# Patient Record
Sex: Female | Born: 1975 | Race: White | Hispanic: No | Marital: Married | Smoking: Never smoker
Health system: Southern US, Community
[De-identification: ages and names within clinical notes are randomized; demographics above are authoritative.]

## PROBLEM LIST (undated history)

## (undated) DIAGNOSIS — H9190 Unspecified hearing loss, unspecified ear: Secondary | ICD-10-CM

## (undated) DIAGNOSIS — L409 Psoriasis, unspecified: Secondary | ICD-10-CM

## (undated) DIAGNOSIS — J302 Other seasonal allergic rhinitis: Secondary | ICD-10-CM

## (undated) DIAGNOSIS — K219 Gastro-esophageal reflux disease without esophagitis: Secondary | ICD-10-CM

## (undated) DIAGNOSIS — R635 Abnormal weight gain: Secondary | ICD-10-CM

## (undated) DIAGNOSIS — T7840XA Allergy, unspecified, initial encounter: Secondary | ICD-10-CM

## (undated) HISTORY — DX: Gastro-esophageal reflux disease without esophagitis: K21.9

## (undated) HISTORY — DX: Allergy, unspecified, initial encounter: T78.40XA

## (undated) HISTORY — DX: Abnormal weight gain: R63.5

## (undated) HISTORY — PX: WISDOM TOOTH EXTRACTION: SHX21

## (undated) HISTORY — DX: Other seasonal allergic rhinitis: J30.2

## (undated) HISTORY — DX: Unspecified hearing loss, unspecified ear: H91.90

## (undated) HISTORY — DX: Psoriasis, unspecified: L40.9

---

## 1999-02-24 ENCOUNTER — Other Ambulatory Visit: Admission: RE | Admit: 1999-02-24 | Discharge: 1999-02-24 | Payer: Self-pay | Admitting: *Deleted

## 2000-03-05 ENCOUNTER — Other Ambulatory Visit: Admission: RE | Admit: 2000-03-05 | Discharge: 2000-03-05 | Payer: Self-pay | Admitting: *Deleted

## 2000-10-12 ENCOUNTER — Other Ambulatory Visit: Admission: RE | Admit: 2000-10-12 | Discharge: 2000-10-12 | Payer: Self-pay | Admitting: Obstetrics and Gynecology

## 2001-02-08 ENCOUNTER — Inpatient Hospital Stay (HOSPITAL_COMMUNITY): Admission: AD | Admit: 2001-02-08 | Discharge: 2001-02-08 | Payer: Self-pay | Admitting: Obstetrics and Gynecology

## 2001-05-14 ENCOUNTER — Inpatient Hospital Stay (HOSPITAL_COMMUNITY): Admission: AD | Admit: 2001-05-14 | Discharge: 2001-05-14 | Payer: Self-pay | Admitting: Obstetrics and Gynecology

## 2001-05-17 ENCOUNTER — Inpatient Hospital Stay (HOSPITAL_COMMUNITY): Admission: AD | Admit: 2001-05-17 | Discharge: 2001-05-19 | Payer: Self-pay | Admitting: Obstetrics and Gynecology

## 2001-05-22 ENCOUNTER — Encounter: Admission: RE | Admit: 2001-05-22 | Discharge: 2001-06-21 | Payer: Self-pay | Admitting: Obstetrics and Gynecology

## 2001-10-09 HISTORY — PX: CHOLECYSTECTOMY: SHX55

## 2002-01-28 ENCOUNTER — Other Ambulatory Visit: Admission: RE | Admit: 2002-01-28 | Discharge: 2002-01-28 | Payer: Self-pay | Admitting: Obstetrics and Gynecology

## 2002-07-18 ENCOUNTER — Emergency Department (HOSPITAL_COMMUNITY): Admission: EM | Admit: 2002-07-18 | Discharge: 2002-07-18 | Payer: Self-pay | Admitting: Emergency Medicine

## 2002-07-19 ENCOUNTER — Inpatient Hospital Stay (HOSPITAL_COMMUNITY): Admission: RE | Admit: 2002-07-19 | Discharge: 2002-07-21 | Payer: Self-pay | Admitting: Surgery

## 2002-07-19 ENCOUNTER — Encounter: Payer: Self-pay | Admitting: Surgery

## 2002-07-20 ENCOUNTER — Encounter: Payer: Self-pay | Admitting: Surgery

## 2002-07-21 ENCOUNTER — Encounter: Payer: Self-pay | Admitting: Surgery

## 2002-07-21 ENCOUNTER — Encounter (INDEPENDENT_AMBULATORY_CARE_PROVIDER_SITE_OTHER): Payer: Self-pay | Admitting: Specialist

## 2003-02-18 ENCOUNTER — Other Ambulatory Visit: Admission: RE | Admit: 2003-02-18 | Discharge: 2003-02-18 | Payer: Self-pay | Admitting: Obstetrics and Gynecology

## 2003-06-17 ENCOUNTER — Encounter: Payer: Self-pay | Admitting: Obstetrics and Gynecology

## 2003-06-17 ENCOUNTER — Inpatient Hospital Stay (HOSPITAL_COMMUNITY): Admission: AD | Admit: 2003-06-17 | Discharge: 2003-06-17 | Payer: Self-pay | Admitting: Obstetrics and Gynecology

## 2003-07-05 ENCOUNTER — Inpatient Hospital Stay (HOSPITAL_COMMUNITY): Admission: AD | Admit: 2003-07-05 | Discharge: 2003-07-05 | Payer: Self-pay | Admitting: Obstetrics and Gynecology

## 2003-09-17 ENCOUNTER — Inpatient Hospital Stay (HOSPITAL_COMMUNITY): Admission: AD | Admit: 2003-09-17 | Discharge: 2003-09-19 | Payer: Self-pay | Admitting: Obstetrics and Gynecology

## 2004-02-19 ENCOUNTER — Other Ambulatory Visit: Admission: RE | Admit: 2004-02-19 | Discharge: 2004-02-19 | Payer: Self-pay | Admitting: Obstetrics and Gynecology

## 2005-10-17 ENCOUNTER — Other Ambulatory Visit: Admission: RE | Admit: 2005-10-17 | Discharge: 2005-10-17 | Payer: Self-pay | Admitting: Obstetrics and Gynecology

## 2006-02-19 ENCOUNTER — Inpatient Hospital Stay (HOSPITAL_COMMUNITY): Admission: AD | Admit: 2006-02-19 | Discharge: 2006-02-19 | Payer: Self-pay | Admitting: Obstetrics and Gynecology

## 2006-05-19 ENCOUNTER — Inpatient Hospital Stay (HOSPITAL_COMMUNITY): Admission: AD | Admit: 2006-05-19 | Discharge: 2006-05-21 | Payer: Self-pay | Admitting: Obstetrics and Gynecology

## 2009-08-23 LAB — CONVERTED CEMR LAB: Pap Smear: NORMAL

## 2010-07-26 ENCOUNTER — Ambulatory Visit: Payer: Self-pay | Admitting: Internal Medicine

## 2010-07-29 ENCOUNTER — Ambulatory Visit: Payer: Self-pay | Admitting: Internal Medicine

## 2010-08-01 LAB — CONVERTED CEMR LAB
BUN: 16 mg/dL (ref 6–23)
Basophils Absolute: 0 10*3/uL (ref 0.0–0.1)
Basophils Relative: 0.6 % (ref 0.0–3.0)
CO2: 27 meq/L (ref 19–32)
Calcium: 9.2 mg/dL (ref 8.4–10.5)
Chloride: 105 meq/L (ref 96–112)
Cholesterol: 142 mg/dL (ref 0–200)
Creatinine, Ser: 0.7 mg/dL (ref 0.4–1.2)
Eosinophils Absolute: 0.5 10*3/uL (ref 0.0–0.7)
Eosinophils Relative: 8 % — ABNORMAL HIGH (ref 0.0–5.0)
GFR calc non Af Amer: 104.87 mL/min (ref >60)
Glucose, Bld: 88 mg/dL (ref 70–99)
HCT: 37.5 % (ref 36.0–46.0)
HDL: 38.6 mg/dL — ABNORMAL LOW (ref >39.00)
Hemoglobin: 13 g/dL (ref 12.0–15.0)
LDL Cholesterol: 93 mg/dL (ref 0–99)
Lymphocytes Relative: 28 % (ref 12.0–46.0)
Lymphs Abs: 1.7 10*3/uL (ref 0.7–4.0)
MCHC: 34.8 g/dL (ref 30.0–36.0)
MCV: 88.6 fL (ref 78.0–100.0)
Monocytes Absolute: 0.6 10*3/uL (ref 0.1–1.0)
Monocytes Relative: 9.1 % (ref 3.0–12.0)
Neutro Abs: 3.4 10*3/uL (ref 1.4–7.7)
Neutrophils Relative %: 54.3 % (ref 43.0–77.0)
Platelets: 222 10*3/uL (ref 150.0–400.0)
Potassium: 4 meq/L (ref 3.5–5.1)
RBC: 4.23 M/uL (ref 3.87–5.11)
RDW: 13.1 % (ref 11.5–14.6)
Sodium: 140 meq/L (ref 135–145)
TSH: 0.81 microintl units/mL (ref 0.35–5.50)
Total CHOL/HDL Ratio: 4
Triglycerides: 53 mg/dL (ref 0.0–149.0)
VLDL: 10.6 mg/dL (ref 0.0–40.0)
WBC: 6.2 10*3/uL (ref 4.5–10.5)

## 2010-11-08 NOTE — Assessment & Plan Note (Signed)
Summary: NEW P T TO ESTABH/DLO   Vital Signs:  Patient profile:   35 year old female Height:      66.5 inches (168.91 cm) Weight:      151.50 pounds (68.86 kg) BMI:     24.17 Temp:     98.2 degrees F (36.78 degrees C) oral Pulse rate:   78 / minute Pulse rhythm:   regular BP sitting:   116 / 70  (left arm) Cuff size:   regular  Vitals Entered By: Selena Batten Dance CMA Duncan Dull) (July 26, 2010 10:26 AM) CC: New patient to establish care   History of Present Illness: CC: new to establish  here to establish.    o/w healthy.  some dry skin here and there, h/o psoriasis, better after pregnancy.  Well woman exams at [facility].  Nl paps, breast exams.   flu shot - would like today. tetanus - unsure when last one was.  Tdap today. never had cholesterol checked  -  Date:  08/23/2009    PAP normal  Current Medications (verified): 1)  None  Allergies (verified): No Known Drug Allergies  Past History:  Past Medical History: seasonal allergies controlled OTC  Past Surgical History: wisdome teeth extracted cholecystectomy 2003  Family History: F: HTN, HLD M: BRCA at 35yo s/p lumpectomy MGF: prostate CA MGM: throat CA PGF: CAD/MI 50s  No other CA, CVA, DM  Social History: No smoking, no EtOH, no rec drugs Occupation: stay at home mom Lives with husband, 3 children (2002, 2004, 2007), 1 dog  Review of Systems  The patient denies anorexia, fever, weight loss, weight gain, vision loss, decreased hearing, hoarseness, chest pain, syncope, dyspnea on exertion, peripheral edema, prolonged cough, headaches, hemoptysis, abdominal pain, melena, hematochezia, severe indigestion/heartburn, hematuria, incontinence, muscle weakness, suspicious skin lesions, transient blindness, difficulty walking, depression, and breast masses.         no n/v/d/c.  Physical Exam  General:  Well-developed,well-nourished,in no acute distress; alert,appropriate and cooperative throughout  examination Head:  Normocephalic and atraumatic without obvious abnormalities. No apparent alopecia or balding. Eyes:  No corneal or conjunctival inflammation noted. EOMI. Perrla.  Ears:  External ear exam shows no significant lesions or deformities.  Otoscopic examination reveals clear canals, tympanic membranes are intact bilaterally without bulging, retraction, inflammation or discharge. Hearing is grossly normal bilaterally. Nose:  External nasal examination shows no deformity or inflammation. Nasal mucosa are pink and moist without lesions or exudates. Mouth:  Oral mucosa and oropharynx without lesions or exudates.  Teeth in good repair. Neck:  No deformities, masses, or tenderness noted. Lungs:  Normal respiratory effort, chest expands symmetrically. Lungs are clear to auscultation, no crackles or wheezes. Heart:  Normal rate and regular rhythm. S1 and S2 normal without gallop, murmur, click, rub or other extra sounds. Abdomen:  Bowel sounds positive,abdomen soft and non-tender without masses, organomegaly or hernias noted. Msk:  No deformity or scoliosis noted of thoracic or lumbar spine.   Pulses:  2+ rad pulses Extremities:  no edema Neurologic:  CN grossly intact, station and gait intact Skin:  Intact without suspicious lesions or rashes Psych:  full affect, pleasant   Impression & Recommendations:  Problem # 1:  HEALTH MAINTENANCE EXAM (ICD-V70.0) flu shot and tdap today.  blood work when returns fasting this week or next week for insurance purposes.  well woman at [facility].  Other Orders: Flu Vaccine 65yrs + (16109) Admin 1st Vaccine (60454) Tdap => 49yrs IM (09811) Admin of Any Addtl Vaccine (  16109)  Patient Instructions: 1)  Return fasting or blood work [FLP, BMP, TSH, CBC V70.0, ] 2)  Tetanus (Tdap) and flu shot today. 3)  Pleasure to meet you today!  Call clinic with questions.   Orders Added: 1)  New Patient 18-39 years [99385] 2)  Flu Vaccine 18yrs + [90658] 3)   Admin 1st Vaccine [90471] 4)  Tdap => 34yrs IM [90715] 5)  Admin of Any Addtl Vaccine [90472]   Immunizations Administered:  Influenza Vaccine # 1:    Vaccine Type: Fluvax 3+    Site: right deltoid    Mfr: GlaxoSmithKline    Dose: 0.5 ml    Route: IM    Given by: Selena Batten Dance CMA (AAMA)    Exp. Date: 04/08/2011    Lot #: [id number]    VIS given: 05/03/10 version given July 26, 2010.  Tetanus Vaccine:    Vaccine Type: Tdap    Site: left deltoid    Mfr: GlaxoSmithKline    Dose: 0.5 ml    Route: IM    Given by: Selena Batten Dance CMA (AAMA)    Exp. Date: 07/28/2012    Lot #: [id number]    VIS given: 08/26/08 version given July 26, 2010.  Flu Vaccine Consent Questions:    Do you have a history of severe allergic reactions to this vaccine? no    Any prior history of allergic reactions to egg and/or gelatin? no    Do you have a sensitivity to the preservative Thimersol? no    Do you have a past history of Guillan-Barre Syndrome? no    Do you currently have an acute febrile illness? no    Have you ever had a severe reaction to latex? no    Vaccine information given and explained to patient? yes    Are you currently pregnant? no   Immunizations Administered:  Influenza Vaccine # 1:    Vaccine Type: Fluvax 3+    Site: right deltoid    Mfr: GlaxoSmithKline    Dose: 0.5 ml    Route: IM    Given by: Janee Morn CMA (AAMA)    Exp. Date: 04/08/2011    Lot #: [id number]    VIS given: 05/03/10 version given July 26, 2010.  Tetanus Vaccine:    Vaccine Type: Tdap    Site: left deltoid    Mfr: GlaxoSmithKline    Dose: 0.5 ml    Route: IM    Given by: Selena Batten Dance CMA (AAMA)    Exp. Date: 07/28/2012    Lot #: [id number]    VIS given: 08/26/08 version given July 26, 2010.  Prior Medications: None Current Allergies (reviewed today): No known allergies    Prevention & Chronic Care Immunizations   Influenza vaccine: Fluvax 3+  (07/26/2010)    Tetanus booster: 07/26/2010:  Tdap   Tetanus booster due: 07/26/2020    Pneumococcal vaccine: Not documented  Other Screening   Pap smear: normal  (08/23/2009)   Smoking status: Not documented

## 2011-02-24 NOTE — Consult Note (Signed)
NAME:  Linda Kemp, Linda Kemp NO.:  [id number]   MEDICAL RECORD NO.:  [id number]                   PATIENT TYPE:  INP   LOCATION:  0455                                 FACILITY:  [facility]   PHYSICIAN:  Iva Boop, M.D. [facility]           DATE OF BIRTH:  1975/12/03   DATE OF CONSULTATION:  07/19/2002  DATE OF DISCHARGE:                                   CONSULTATION   REQUESTING PHYSICIAN:  Currie Paris, M.D.   PRIMARY CARE PHYSICIAN:  Marne A. Tower, M.D.   CHIEF COMPLAINT/REASON FOR CONSULTATION:  Elevated liver chemistries,  jaundice, cholelithiasis.   HISTORY OF PRESENT ILLNESS:  This is a pleasant 35 year old white female  that had been doing reasonably well until the past week or so when she has  had intermittent worsening epigastric and chest pain radiating to the back.  This is postprandial, and has been accompanied by nausea and vomiting.  She  saw Dr. Milinda Antis.  An ultrasound was performed at [facility], and according to Dr.  Jamey Ripa there were gallstones, and she had a dilated common bile duct as it  was communicated to me.  She was to have a laparoscopic cholecystectomy, but  her bilirubin was found to be greater than 3, and her AST and ALT were  elevated as well.  He was concerned about possible choledocholithiasis.  She  is admitted to the hospital.  She is comfortable at this time.   REVIEW OF SYMPTOMS:  A comprehensive review of systems is negative for  fever, respiratory complaints, and all other systems are negative except for  mild pruritus at this time.   HOME MEDICATIONS:  1. Birth control pills.  2. Vicodin.  3. Multivitamin.   ALLERGIES:  No known drug allergies.  No contrast or iodine allergy.   PAST MEDICAL HISTORY:  1. Psoriasis.  2. Childbirth.   FAMILY HISTORY:  Her mother has had a cholecystectomy.  Father has had  gastroesophageal reflux disease.  No colon cancer.   SOCIAL HISTORY:  She has been a Runner, broadcasting/film/video.  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  No tobacco or alcohol.  She is here with her husband.   PHYSICAL EXAMINATION:  GENERAL:  The patient is in no acute distress at this  time.  VITAL SIGNS:  Blood pressure is 100/68, weight 152 pounds, pulse 65,  respirations 20, temperature 97.6.  HEENT:  Eyes with slight icterus.  Mouth:  Palate slightly icteric.  SKIN:  Looks normal, but perhaps slightly icteric.  LUNGS:  Clear.  NECK:  Supple, no thyromegaly or mass.  HEART:  S1 and S2, no murmurs, rubs, or gallops.  ABDOMEN:  Soft, nontender, bowel sounds are present.  There is no  organomegaly or mass.  EXTREMITIES:  No cyanosis, clubbing, or edema.  LYMPH NODES:  No neck or supraclavicular nodes, no inguinal nodes.  MENTAL STATUS:  She is alert and oriented x3.   LABORATORY DATA:  Labs from 07/18/02, showed hemoglobin 17, hematocrit 49%,  white count 9000.  BMET looked normal.  hCG negative.  Her glucose was 121.  Urine had a large amount of bile.  Today's labs:  Lipase 18, amylase 42,  these are normal.  Hepatic functions show alkaline phosphatase 177, total  bilirubin 3.6, direct 1.9, indirect 1.7, SGOT 91, SGPT 214, total protein  7.3, albumin 3.7.   ASSESSMENT:  Cholelithiasis with mild jaundice and dilated common bile duct  on ultrasound per verbal information.  Even without dilation, the elevated  bilirubin is indicative of increased risk of common duct stones.   PLAN:  Endoscopic retrograde cholangiopancreatography to evaluate the bile  duct for possible common duct stones and removal if necessary.  I fully  explained the risks, benefits, indications regarding endoscopic retrograde  cholangiopancreatography, biliary sphincterotomy, and stone extraction.  Her  husband was present for that conversation.  She understands these and agrees  to proceed.   I appreciate the opportunity to care for this patient.                                                Iva Boop, M.D.  [facility]    CEG/MEDQ  D:  07/19/2002  T:  07/19/2002  Job:  [id number]   cc:   Currie Paris, M.D.  Fax: [phone]   Marne A. Milinda Antis, M.D. [facility]

## 2011-02-24 NOTE — Op Note (Signed)
   NAME:  Linda Kemp, Linda Kemp                        ACCOUNT NO.:  [id number]   MEDICAL RECORD NO.:  [id number]                   PATIENT TYPE:  INP   LOCATION:  0455                                 FACILITY:  [facility]   PHYSICIAN:  Lina Sar, MD [facility]                 DATE OF BIRTH:  [birth year]   DATE OF PROCEDURE:  07/21/2002  DATE OF DISCHARGE:                                 OPERATIVE REPORT   PROCEDURE:  ERCP.   INDICATIONS FOR PROCEDURE:  This 35 year old black female presented with  choledocholithiasis and cholecystitis.  She underwent laparoscopic  cholecystectomy yesterday with findings of retained common bile duct stone  in the distal common bile duct.  She had an ERCP prior to the  cholecystectomy with sphincterotomy and sweep of the common bile duct with  removal of small stone.  She is now undergoing repeat ERCP because of  retained stone in the distal common bile duct.   ENDOSCOPE:  Olympus single channel side viewing duodenoscope.   SEDATION:  Versed 10 mg, IV Demerol 90 mg IV.   FINDINGS:  Olympus single channel side viewing duodenoscope  was passed  blindly through the esophagus into the stomach, through the pylorus, into  the duodenum.  Minor papilla was located and each papilla was distal to that  and it showed signs of recent sphincterotomy.  It was cannulated without  difficulty with replacement of a guidewire into the common bile duct.  Cholangiogram showed normal size common bile duct of about 5 mm in diameter  with retained distal common bile duct stone at the location which was  identical to the one on intraoperative cholangiogram.  An 8.5 mm balloon  swept the common bile duct under fluoroscopic guidance with recovery of 5 mm  multifaceted stone.  Two more sweeps of the common bile duct did not yield  any additional stones or gravel.  An occlusion cholangiogram was obtained  with showing abnormal common bile duct.  Cystic duct remnant was seen.   IMPRESSION:   Choledocholithiasis with retained distal common bile duct  stones, status post removal via previous sphincterotomy.   PLAN:  Observation this morning.  The patient may advance diet.  She may be  able to be discharged later today and follow up with Dr. Jamey Ripa for  postoperative check up.                                                Lina Sar, MD [facility]    DB/MEDQ  D:  07/21/2002  T:  07/21/2002  Job:  [id number]   cc:   Currie Paris, M.D.  Fax: [phone]

## 2011-02-24 NOTE — Discharge Summary (Signed)
[facility]  Patient:    Linda Kemp, Linda Kemp                     MRN: [id number] Adm. Date:  [id number] Disc. Date: 05/19/01 Attending:  Leonard Schwartz Dictator:   Philipp Deputy, C.N.M.                           Discharge Summary  DATE OF BIRTH:                [birth year].  ADMITTING DIAGNOSES:          1. Intrauterine pregnancy at 41-3/7 weeks.                               2. Postdates elective induction.  DISCHARGE DIAGNOSES:          1. Intrauterine pregnancy at term.                               2. Operative vaginal birth with vacuum                                  extraction.  PROCEDURES:                   1. Operative vaginal birth.                               2. Repair of second-degree laceration.  HOSPITAL COURSE:              Linda Kemp is a 35 year old primigravida who presented for induction of labor secondary to postdates on May 17, 2001. Pregnancy had been remarkable for (1) irregular cycles, (2) has foster child with HIV, (3) father of the baby is adopted, (4) Rh negative, (5) history of psoriasis, and (6) positive group B strep. Upon admission, the patient was 3 to 4 cm and agreed to artificial rupture of membranes as method of labor induction. She progressed steadily in labor throughout the day needing Pitocin for augmentation at approximately 5 cm. When she got to complete dilatation, she required a vacuum extraction secondary to nonreassuring tracing. Vacuum delivery was performed by Dr. Stefano Gaul for a viable female infant, Fleet Contras, weight was 8 pounds 12 ounces with Apgars of 9 and 9. The patient was breast-feeding. Hemoglobin was 12.3 on day #1 postpartum. By day #2 postpartum the patient was doing well and was deemed to have received the full benefit of her hospital stay. She desired Micronor for contraception. She was discharged home.  DISCHARGE INSTRUCTIONS:       Instructions are per [facility] handout.  DISCHARGE MEDICATIONS:        1. Motrin 600 mg p.o. q.6h. p.r.n. pain.                               2. Prenatal vitamin one p.o. q.d.                               3. Micronor one p.o. q.d. to start two Sundays  after delivery.  DISCHARGE FOLLOWUP:           Followup will occur at six weeks postpartum at [facility]. DD:  05/19/01 TD:  05/19/01 Job: [id number]

## 2011-02-24 NOTE — Op Note (Signed)
[facility]  Patient:    Linda Kemp, Linda Kemp                     MRN: [id number] Proc. Date: 05/17/01 Adm. Date:  [id number] Attending:  Leonard Schwartz                           Operative Report  PREOPERATIVE DIAGNOSES:       1. A 41-1/[redacted] week gestation.                               2. Nonreassuring fetal heart rate tracing.  POSTOPERATIVE DIAGNOSES:      1. A 41-1/[redacted] week gestation.                               2. Nonreassuring fetal heart rate tracing.                               3. Second degree midline laceration.  PROCEDURE:                    Vacuum extraction vaginal delivery with repair                               of second degree midline laceration.  OBSTETRICIAN:                 Janine Limbo, M.D.  FIRST ASSISTANT:              Wynelle Bourgeois, CNM  ANESTHETIC:                   Epidural.  DISPOSITION:                  The patient is a 35 year old female, gravida 1, para 0.  She presents at 41-1/[redacted] weeks gestation.  She was admitted for induction on May 17, 2001.  She was started on Pitocin as well as having her membranes ruptured.  She dilated her cervix to 10 cm and began pushing.  With pushing, she began having variable decelerations.  She was able to bring the babys head to a +3 station, but then was not making progress beyond that point.  Her pelvis was noted to be adequate.  The estimated fetal weight was approximately 7.5 to 8 pounds.  We discussed our options for management. These options included observation only, continued pushing, operative vaginal delivery, and cesarean section.  The risks and benefits of each of those options were reviewed.  The patient elected to proceed with operative vaginal delivery.  We discussed the merits of forceps vaginal delivery versus vacuum extraction of vaginal delivery.  The patient and her husband elected to proceed with vacuum extraction vaginal delivery.  The specific  risks associated with vacuum extraction were outlined including a risk of caput formation, hematoma formation, the rare risk of intracranial bleeding, and the risks that the vacuum extraction would be unsuccessful and that we would still need to proceed with cesarean delivery.  After carefully considering all those options, the patient was ready to proceed with vacuum extraction.  FINDINGS:  The weight of the infant is currently not known.  A female infant was  delivered named Fleet Contras.  The Apgars were 9 at one minute and 9 at five minutes.  There was a three-vessel umbilical cord present.  The placenta appeared normal.  There was a second degree midline laceration present.  There were no upper vaginal or cervical lacerations present.  PROCEDURE:  The patient was placed in a lithotomy position.  The perineum and vagina were prepped with multiple layers of Betadine and then sterilely draped.  The bladder had previously been drained of urine.  The fetal head was at a +3 station.  The cervix was completely dilated and 100% effaced.  The infant was in an occiput anterior presentation.  The KIWI vacuum extractor was applied and the patient was allowed to push.  We were able to deliver the fetal head without difficulty.  The mouth and nose were suctioned.  The remained of the infant was then delivered.  The cord was clamped and cut and the infant was allowed to remain on the mothers abdomen for bonding.  Routine cord blood studies were obtained.  The placenta was removed.  The second degree midline laceration was closed first by putting reinforcing sutures in the capsule of the sphincter ani.  A 2-0 Vicryl was used for this.  We then used 3-0 Vicryl in a running fashion to close the second degree midline laceration with the standard closure.  The patient tolerated her procedure well.  She was returned to the supine position.  The estimated blood loss for the procedure was 300 cc.  The infant,  again, was allowed to remain in the room with the mother and father.  The patient was returned to the supine position. DD:  05/17/01 TD:  05/18/01 Job: [id number]

## 2011-02-24 NOTE — Op Note (Signed)
NAME:  Linda Kemp, Linda Kemp                        ACCOUNT NO.:  [id number]   MEDICAL RECORD NO.:  [id number]                   PATIENT TYPE:  INP   LOCATION:  0455                                 FACILITY[facility]  [facility]   PHYSICIAN:  Currie Paris, M.D.           DATE OF BIRTH:  Jul 15, 1976   DATE OF PROCEDURE:  07/20/2002  DATE OF DISCHARGE:                                 OPERATIVE REPORT   PREOPERATIVE DIAGNOSIS:  Chronic calculus cholecystitis.   POSTOPERATIVE DIAGNOSES:  1. Chronic calculus cholecystitis.  2. Choledocholithiasis.   OPERATION:  Laparoscopic cholecystectomy with operative cholangiogram.   SURGEON:  Currie Paris, M.D.   ASSISTANT:  Adolph Pollack, M.D.   ANESTHESIA:  General endotracheal.   CLINICAL HISTORY:  This patient is a 35 year old with recent episode of  biliary colic type symptoms and also some radiating through to her back plus  jaundice.  She was admitted yesterday and underwent ERCP with removal of  stones from her common duct.  She was scheduled electively today for  cholecystectomy.   DESCRIPTION OF PROCEDURE:  The patient was seen in the holding area and had  no further questions.  She was taken to the operating room and after  satisfactory general endotracheal anesthesia had been obtained, the abdomen  was prepped and draped.  Marcaine 0.25% plain was used for each incision,  and the umbilical incision was made first.  The fascia was opened and the  peritoneal cavity entered under direct vision.  A pursestring was placed and  the Hasson introduced and the abdomen insufflated to 15.   The abdomen basically appeared normal.  The patient was placed in reverse  Trendelenburg, tilted to the left.  A 10 mm trocar was placed in the  epigastrium and two 5's laterally.  The gallbladder was retracted over the  liver, and traction on the infundibulum and the area of the peritoneum over  the cystic duct was opened.  This tissue was fairly  thickened with some  chronic inflammatory changes, and I gently got around what I thought was the  cystic duct and could trace it down towards the common duct, and I could see  what appeared to be a dilated common duct.  I also found the anterior cystic  artery and got around that and clipped it and divided it so that I would  have a little bit more mobility here and opened up a fairly big window  behind the cystic duct.   The cystic duct was clipped once at its junction with the gallbladder and  opened.  I then introduced a Cook catheter, and operative cholangiography  was done.  I could see the cystic duct filling, common duct filling into the  duodenum, and into the hepatic radicles.  However, there did appear to be a  filling defect consistent with an oblong-looking stone.  I decided not to do  any further manipulation, although the cystic  duct was quite thickened such  that I could not get a clip all the way across it.  Most of this was  thickened in the wall, and the lumen was fairly small.  In addition, I was  afraid to manipulate the distal duct too much with the recent  sphincterotomy.  Therefore, we elected not to do any common duct  manipulation today.   The cystic duct was divided and then grasped and two Endoloops placed on it  using 0 chromic.  These tied down easily, and then a clip was placed over  distal to those two.   Retraction, I identified what I thought initially was the posterior artery  which was clipped and divided and I was coming up, we found another branch  which actually was the posterior artery, and several clips were placed on  that.  The gallbladder was removed from below to above and, just prior to  disconnecting, we irrigated and made sure everything appeared to be dry.  The gallbladder was disconnected, placed in a bag, and brought out the  umbilical port.  That port was occluded for a few moments while we irrigated  and made a final check for  hemostasis and again, everything appeared to be  dry.  The lateral ports were removed, and there was no bleeding.  The umbilical  port was removed and the pursestring tied down to close this using the  camera under direct vision to make sure we did not catch any loops of bowel  in that closure.  The abdomen was deflated through the epigastric port.  The  skin was closed with 4-0 Monocryl subcuticular plus Steri-Strips.  The  patient tolerated the procedure well.  There were no operative  complications, and all counts were correct.                                               Currie Paris, M.D.    CJS/MEDQ  D:  07/20/2002  T:  07/20/2002  Job:  [id number]   cc:   Marne A. Milinda Antis, M.D. [facility]   Iva Boop, M.D. [facility]  [address] [place], [place] [address]  Fax: [phone]
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
## 2011-02-24 NOTE — Discharge Summary (Signed)
   Linda Kemp, Linda Kemp                        ACCOUNT NO.:  [id number]   MEDICAL RECORD NO.:  [id number]                   PATIENT TYPE:  INP   LOCATION:  0455                                 FACILITY:  [facility]   PHYSICIAN:  Currie Paris, M.D.           DATE OF BIRTH:  Aug 06, 1976   DATE OF ADMISSION:  07/19/2002  DATE OF DISCHARGE:  07/21/2002                                 DISCHARGE SUMMARY   FINAL DIAGNOSES:  1. Chronic calculus cholecystitis.  2. Choledocholithiasis.   CLINICAL HISTORY:  The patient is a 35 year old woman who presented with  abdominal pain, nausea, jaundice, and cholelithiasis.  Details are noted in  the admission note.  Clinically, she was thought to have chronic  cholecystitis with a common duct stone.  The patient was admitted on October  11 and ERCP accomplished that day with a single 7 mm stone removed from the  common duct.  She felt better the next day and was taken to the operating  room where laparoscopic cholecystectomy was done.  Operative cholangiogram,  unfortunately, showed a residual common duct stone so the patient was kept  an extra day and on October 13 underwent another ERCP with another stone  retrieved and clearance of the common duct.  She tolerated that nicely and  was able to be discharged later that day.   The patient was sent home on Vicodin for pain, regular diet as tolerated,  showers as needed, and to follow up in our office in approximately two  weeks.   Pathology report confirmed chronic cholecystitis and cholelithiasis.   LABORATORY STUDIES:  Initial bilirubin 3.6 and on October 13 just prior to  her last ERCP was down to 2.1.  She had mild elevations in AST, ALT, and ALP  as well.                                               Currie Paris, M.D.    CJS/MEDQ  D:  08/07/2002  T:  08/07/2002  Job:  [id number]   cc:   Marne A. Milinda Antis, M.D. [facility]   Iva Boop, M.D. [facility]  [address] [place], [place] [address]  Fax: 1

## 2011-02-24 NOTE — H&P (Signed)
NAME:  Linda Kemp, HUSH NO.:  [id number]   MEDICAL RECORD NO.:  [id number]          PATIENT TYPE:  MAT   LOCATION:  MATC                          [facility]ILITY:  [facility]   PHYSICIAN:  Osborn Coho, M.D.   DATE OF BIRTH:  May 22, 1976   DATE OF ADMISSION:  05/19/2006  DATE OF DISCHARGE:                                HISTORY & PHYSICAL   Linda Kemp is a 35 year old gravida 3, para 2-0-0-2, at 40-4/7 weeks,  who presented with irregular contractions since yesterday.  Cervix was 3-4  cm in the office then.  Her pregnancy has been remarkable for:   1. Rh negative.  2. Positive group B strep.  3. Plans vasectomy.   PRENATAL LABORATORY DATA:  Blood type is O negative, Rh antibody negative.  VDRL nonreactive.  Rubella titer positive.  Hepatitis B surface antigen  negative.  Cystic fibrosis testing was negative.  Pap was normal in January.  GC and chlamydia cultures were declined.  Group B strep culture was positive  at 36 weeks.  Hemoglobin upon entering the practice was 12.6.  It was 11.9  at 26 weeks.  Glucola was normal.  EDC of May 15, 2006, was established by  last menstrual period and was in agreement with ultrasound at approximately  18 weeks.  The patient declined quadruple screen.   HISTORY OF PRESENT PREGNANCY:  The patient entered care at approximately 10  weeks.  She was exposed to fifth disease at approximately 14 weeks.  She did  have immune titers to parvo noted.  She declined quadruple screen.  She had  an ultrasound at 18 weeks showing normal growth and development with fundal  placenta.  She had a normal Glucola.  She did receive RhoGAM.  She initially  had planned a tubal ligation but now has decided that she and her husband  are planning vasectomy.  Group B strep culture was positive at 36 weeks.  The rest of her pregnancy was essentially uncomplicated.   OBSTETRICAL HISTORY:  In 2002 she had a vaginal birth of a female infant,  weight 8 pounds 12  ounces, at 41-1/2 weeks.  She was in labor 11 hours.  She  had epidural anesthesia.  That was a vacuum-assisted vaginal birth secondary  to a nonreassuring fetal heart rate.  In 2004 she had a vaginal birth of a  female infant that weighed 8 pounds 3 ounces, at 40-1/7 weeks.  She was in  labor 5 hours.  She had epidural anesthesia with no complications.  She did  receive RhoGAM on both pregnancies.   MEDICAL HISTORY:  She was on Micronor in the past and other OCPs.  She had  beta strep with both of her pregnancies.  She had a left breast biopsy in  the past that was benign.  She reports the usual childhood illnesses.  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  This did resolve.  She had a questionable history of  gastroesophageal reflux disease.  This resolved after a cholecystectomy.  She has had a history of UTIs in the past.  She had a history of severe  psoriasis, but that now resolved.   SURGICAL HISTORY:  Gallbladder surgery in 2003, a mole of her left breast  removed in 1999, wisdom teeth removed in the past.  The patient's only other  hospitalization was for childbirth.   The patient has no known medication allergies.   FAMILY HISTORY:  Her paternal grandfather had an MI at age 20.  Her father  is hypertensive on medications.  Her paternal grandfather is also  hypertensive.  Her paternal uncle is an insulin-dependent diabetic.  Maternal grandmother had throat cancer.  Maternal grandfather had prostate  cancer with metastases.   GENETIC HISTORY:  There is no genetic history of significance.  The father  of the baby is adopted, so limited information is known.   SOCIAL HISTORY:  The patient is Caucasian, of the [employer].  He is  married to the father of the baby.  He is involved and supportive.  His name  is Dulse Rutan.  The patient is college-educated.  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  The patient's husband is  graduate-educated.  He is in Airline pilot.  She has been followed by the certified  nurse midwife of [place].  She denies any alcohol, drug or  tobacco use during this pregnancy.   PHYSICAL EXAMINATION:  VITAL SIGNS:  Stable.  The patient is afebrile.  HEENT:  Within normal limits.  LUNGS:  Bilateral breath sounds are clear.  CARDIAC:  Regular rate and rhythm without murmur.  BREASTS:  Soft and nontender.  ABDOMEN:  Fundal height is approximately 38 cm.  Estimated fetal weight is 8  to 8-1/2 pounds.  Uterine contractions are every 3-5 minutes, mild quality.  PELVIC:  Cervical exam is 5 cm, 70%, vertex at -2 station.  MONITORING:  Fetal heart rate is reactive with no decelerations.  EXTREMITIES:  Deep tendon reflexes are 2+ without clonus.  There is a trace  edema noted.   IMPRESSION:  1. Intrauterine pregnancy at 40-4/7 weeks.  2. Early labor with advanced cervical dilatation.  3. Positive group B strep.   PLAN:  1. Admit to birthing suite per consult with Dr. Su Hilt, who is attending      physician.  2. Routine certified nurse midwife orders.  3. Plan group B prophylaxis and Pitocin augmentation p.r.n. and artificial      rupture of membranes as vertex descends.  4. Epidural p.r.n.      Renaldo Reel Linda Kemp, C.N.M.      Osborn Coho, M.D.  Electronically Signed    VLL/MEDQ  D:  05/19/2006  T:  05/19/2006  Job:  [id number]

## 2011-02-24 NOTE — H&P (Signed)
[facility]  Patient:    Linda Kemp, Linda Kemp                     MRN: [id number] Attending:  Janine Limbo, M.D. Dictator:   Philipp Deputy, C.N.M.                         History and Physical  DATE OF BIRTH:                January 01, 1976.  HISTORY:                      Linda Kemp is a married, white female, primigravida at 41-2/7 weeks estimated gestational age who is being scheduled for induction of labor secondary to postdates. She was seen in the office today, May 16, 2001, with a reactive NST. Her cervix was 3 cm, 80%, vertex, -1, and her blood pressure was within normal limits, and she has not had any proteinuria with this pregnancy. She reports positive fetal movement and denies leaking or bleeding or contractions. Her pregnancy has been followed by the [facility] certified nurse midwife service and has been essentially uncomplicated although remarkable for (1) irregular cycles, (2) has foster child with HIV, (3) father of the baby is adopted, (4) Rh negative, (5) history of psoriasis, and (6) positive group B strep.  PRENATAL LABORATORY DATA:     Her prenatal labs were collected on October 16, 2000. Hemoglobin and hematocrit were 12.1 and 35.9, platelets were 355,000, blood type O negative, antibody negative, RPR nonreactive, rubella immune, hepatitis B surface antigen negative, HIV nonreactive, Pap smear within normal limits, gonorrhea negative, Chlamydia negative. Maternal serum alpha-fetoprotein collected on November 09, 2000 was within normal range. One-hour glucola collected on Feb 08, 2001 was 112 and her hemoglobin at that same time was 11.8. On April 09, 2001 culture of the vaginal tract for group beta strep was positive and repeat HIV testing was nonreactive.  HISTORY OF PRESENT PREGNANCY: She presented for prenatal care at approximately [redacted] weeks gestation. Pregnancy ultrasonography on December 07, 2000 was consistent with last  menstrual period dating. On Feb 08, 2001 at [redacted] weeks gestation the patient received antibody screen and RhoGAM at [facility] [place]. The rest of her prenatal care was unremarkable.  OBSTETRICAL HISTORY:          She is a primigravida.  MEDICAL HISTORY:              She reports having had the usual childhood illnesses. She has used oral contraceptives for the past four years and stopped at the end of August of 2001. At the age of [age] she reports being hospitalized with purpura. She reports having a history of indigestion and reflux and being on Prilosec about a year ago for approximately six months with no problems now. She has a history of UTIs with none in the past three years.  SURGICAL HISTORY:             She has had wisdom teeth extraction and a mole on her left breast removed in June of 1999.  FAMILY HISTORY:               Paternal grandfather with myocardial infarction. Father with hypertension and on blood pressure medication and cholesterol medication. Paternal grandfather with hypertension. Paternal uncle with insulin-dependent diabetes mellitus. Maternal grandmother with throat cancer. Maternal grandfather with prostate cancer with metastasis  to the bone and brain.  GENETIC HISTORY:              Father of the baby is adopted, and is noncontributory from the patients side of the family.  SOCIAL HISTORY:               She is married to Linda Kemp who is involved and supportive. They are of the [employer] faith. They both are college educated. 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  OBJECTIVE DATA:  VITAL SIGNS:                  Vital signs are stable and she is afebrile at the office today.  HEENT:                        Grossly within normal limits.  LUNGS:                        Clear to auscultation.  HEART:                        Regular in rate and  rhythm.  ABDOMEN:                      Gravid in contour with fundal height of approximately 39 cm.  ELECTRONIC FETAL MONITORING:  No uterine contractions with fetal heart rate Doppler in the 140s and a reactive NST.  PELVIC:                       Cervix was 3 cm, 80%, vertex, -1.  EXTREMITIES:                  Within normal limits.  ASSESSMENT:                   1. Intrauterine pregnancy at term.                               2. Induction of labor secondary to postdates.                               3. Favorable cervix.  PLAN:                         1. Admit for induction on May 17, 2001 at                                  6 a.m. per consult with Dr. Stefano Gaul.                               2. Routine CNM orders.                               3. Planned rupture of membranes as induction  method.    ALLERGIES:                    No known drug allergies. DD:  05/16/01 TD:  05/16/01 Job: [id number]

## 2012-09-20 ENCOUNTER — Ambulatory Visit (INDEPENDENT_AMBULATORY_CARE_PROVIDER_SITE_OTHER): Payer: 59 | Admitting: Obstetrics and Gynecology

## 2012-09-20 ENCOUNTER — Encounter: Payer: Self-pay | Admitting: Obstetrics and Gynecology

## 2012-09-20 VITALS — BP 120/68 | Resp 14 | Wt 160.0 lb

## 2012-09-20 DIAGNOSIS — Z01419 Encounter for gynecological examination (general) (routine) without abnormal findings: Secondary | ICD-10-CM

## 2012-09-20 DIAGNOSIS — Z124 Encounter for screening for malignant neoplasm of cervix: Secondary | ICD-10-CM

## 2012-09-20 NOTE — Progress Notes (Signed)
Subjective:    Linda Kemp is a 36 y.o. female, G3P0003, who presents for an annual exam.  Denies any complaints     History   Social History  . Marital Status: Married    Spouse Name: N/A    Number of Children: N/A  . Years of Education: N/A   Social History Main Topics  . Smoking status: Never Smoker   . Smokeless tobacco: Never Used  . Alcohol Use: No  . Drug Use: No  . Sexually Active: Yes -- Female partner(s)    Birth Control/ Protection: None     Comment: married    Other Topics Concern  . None   Social History Narrative  . None    Menstrual cycle:   LMP: Patient's last menstrual period was 09/13/2012.           Cycle: regular   The following portions of the patient's history were reviewed and updated as appropriate: allergies, current medications, past family history, past medical history, past social history, past surgical history and problem list.  Review of Systems Pertinent items are noted in HPI. Breast:Negative for breast lump,nipple discharge or nipple retraction Gastrointestinal: Negative for abdominal pain, change in bowel habits or rectal bleeding Urinary:negative   Objective:    BP 120/68  Resp 14  Wt 160 lb (72.576 kg)  LMP 09/13/2012    Weight:  Wt Readings from Last 1 Encounters:  09/20/12 160 lb (72.576 kg)          BMI: There is no height on file to calculate BMI.  General Appearance: Alert, appropriate appearance for age. No acute distress HEENT: Grossly normal Neck / Thyroid: Supple, no masses, nodes or enlargement Lungs: clear to auscultation bilaterally Back: No CVA tenderness Breast Exam: No dimpling, nipple retraction or discharge. No masses or nodes. and No masses or nodes.No dimpling, nipple retraction or discharge. Cardiovascular: Regular rate and rhythm. S1, S2, no murmur Gastrointestinal: Soft, non-tender, no masses or organomegaly Pelvic Exam: Vulva and vagina appear normal. Bimanual exam reveals normal uterus and  adnexa. Rectovaginal: not indicated Lymphatic Exam: Non-palpable nodes in neck, clavicular, axillary, or inguinal regions Skin: no rash or abnormalities Neurologic: Normal gait and speech, no tremor  Psychiatric: Alert and oriented, appropriate affect.   Wet Prep:not applicable Urinalysis:not applicable UPT: Not done   Assessment:    Normal gyn exam    Plan:    pap smear return annually or prn STD screening: declined Contraception:none   S.Sonali Wivell, CNM

## 2012-09-20 NOTE — Progress Notes (Signed)
The patient reports:no complaints  Contraception:no method  Last mammogram: never  Last pap: 08/30/10 WNL  GC/Chlamydia cultures offered: declined HIV/RPR/HbsAg offered:  declined HSV 1 and 2 glycoprotein offered: declined  Menstrual cycle regular and monthly: Yes Menstrual flow normal: Yes  Urinary symptoms: none Normal bowel movements: Yes Reports abuse at home: No

## 2012-09-23 LAB — PAP IG W/ RFLX HPV ASCU

## 2013-06-12 ENCOUNTER — Encounter: Payer: Self-pay | Admitting: Family Medicine

## 2013-06-12 ENCOUNTER — Ambulatory Visit (INDEPENDENT_AMBULATORY_CARE_PROVIDER_SITE_OTHER): Payer: BC Managed Care – PPO | Admitting: Family Medicine

## 2013-06-12 VITALS — BP 134/80 | HR 72 | Temp 98.1°F | Ht 67.0 in | Wt 170.2 lb

## 2013-06-12 DIAGNOSIS — Z0000 Encounter for general adult medical examination without abnormal findings: Secondary | ICD-10-CM

## 2013-06-12 DIAGNOSIS — R51 Headache: Secondary | ICD-10-CM

## 2013-06-12 DIAGNOSIS — R635 Abnormal weight gain: Secondary | ICD-10-CM

## 2013-06-12 NOTE — Progress Notes (Signed)
  Subjective:    Patient ID: Linda Kemp, female    DOB: 09-18-1976, 37 y.o.   MRN: [id number]  HPI CC: discuss weight and headaches  Not seen here since 07/2010.  Worried about weight - 20 lb weight gain in last 4 months.  Weighed 149lbs 01/31/2013.  No changes in lifestyle.  Trying to eat healthier (more fruits/vegetables, decreasing portion sizes, drinks water) and tried to increased exercise (walking at neighborhood - 2-3 d/wks).  Just joined gym.   Wt Readings from Last 3 Encounters:  06/12/13 170 lb 4 oz (77.225 kg)  09/20/12 160 lb (72.576 kg)  07/26/10 151 lb 8 oz (68.72 kg)   BP Readings from Last 3 Encounters:  06/12/13 134/80  09/20/12 120/68  07/26/10 116/70  Hair may be more brittle.  Occasional palpitations. No fevers/chills, heat or cold intolerance, diarrhea/constipation.  No chest pain, dizziness, lightheadedness.  No double vision.  HA - over last few weeks, noticing increasing headaches (about 2 a week).  Tylenol doesn't help.  Notices headaches more after exertion.  Dull ache.  Bilateral frontal and temporal pain.  No vision changes, nausea, photo/phonophobia.  No h/o headaches in the past.  No unilateral numbness or weakness.  No congestion, rhinorrhea, sinus pressure, ST.   7 hours of sleep at night. 1-2 cups coffee in am, no more No change in stress.  H/o psoriasis - improved with first pregnancy, now notes return on scalp and on palms.  No fmhx thyroid disease. No early menopause in family. Regular periods, LMP 05/23/2013.  Husband with vasectomy.  Preventative: Well woman exams [facility]. Nl paps, breast exams.  Tdap 07/2010  Past Medical History  Diagnosis Date  . Seasonal allergies     controlled OTC    Review of Systems Per HPI    Objective:   Physical Exam  Nursing note and vitals reviewed. Constitutional: She is oriented to person, place, and time. She appears well-developed and well-nourished. No distress.  HENT:   Head: Normocephalic and atraumatic.  Mouth/Throat: Oropharynx is clear and moist. No oropharyngeal exudate.  Eyes: Conjunctivae and EOM are normal. Pupils are equal, round, and reactive to light. No scleral icterus.  Neck: Normal range of motion. Neck supple. No thyromegaly present.  Cardiovascular: Normal rate, regular rhythm, normal heart sounds and intact distal pulses.   No murmur heard. Pulmonary/Chest: Effort normal and breath sounds normal. No respiratory distress. She has no wheezes. She has no rales.  Musculoskeletal: She exhibits no edema.  Lymphadenopathy:    She has no cervical adenopathy.  Neurological: She is alert and oriented to person, place, and time. She has normal strength. No cranial nerve deficit or sensory deficit. She displays a negative Romberg sign. Coordination and gait normal.  CN 2-12 intact  nl FTN No pronator drift  Skin: Skin is warm and dry. No rash noted.  Psychiatric: She has a normal mood and affect.       Assessment & Plan:

## 2013-06-12 NOTE — Patient Instructions (Signed)
Return tomorrow for fasting blood work. We will call you with results

## 2013-06-13 ENCOUNTER — Other Ambulatory Visit (INDEPENDENT_AMBULATORY_CARE_PROVIDER_SITE_OTHER): Payer: BC Managed Care – PPO

## 2013-06-13 DIAGNOSIS — R519 Headache, unspecified: Secondary | ICD-10-CM | POA: Insufficient documentation

## 2013-06-13 DIAGNOSIS — R51 Headache: Secondary | ICD-10-CM | POA: Insufficient documentation

## 2013-06-13 DIAGNOSIS — R635 Abnormal weight gain: Secondary | ICD-10-CM | POA: Insufficient documentation

## 2013-06-13 DIAGNOSIS — Z0000 Encounter for general adult medical examination without abnormal findings: Secondary | ICD-10-CM

## 2013-06-13 LAB — CBC WITH DIFFERENTIAL/PLATELET
Basophils Relative: 0.5 % (ref 0.0–3.0)
Eosinophils Absolute: 0.4 10*3/uL (ref 0.0–0.7)
Eosinophils Relative: 5.9 % — ABNORMAL HIGH (ref 0.0–5.0)
HCT: 39.6 % (ref 36.0–46.0)
Lymphs Abs: 1.8 10*3/uL (ref 0.7–4.0)
MCHC: 33.8 g/dL (ref 30.0–36.0)
MCV: 88 fl (ref 78.0–100.0)
Monocytes Absolute: 0.6 10*3/uL (ref 0.1–1.0)
Neutrophils Relative %: 60.7 % (ref 43.0–77.0)
Platelets: 231 10*3/uL (ref 150.0–400.0)
WBC: 7.2 10*3/uL (ref 4.5–10.5)

## 2013-06-13 LAB — COMPREHENSIVE METABOLIC PANEL
ALT: 18 U/L (ref 0–35)
AST: 21 U/L (ref 0–37)
Albumin: 4.2 g/dL (ref 3.5–5.2)
Alkaline Phosphatase: 61 U/L (ref 39–117)
BUN: 11 mg/dL (ref 6–23)
Calcium: 9.3 mg/dL (ref 8.4–10.5)
Chloride: 106 mEq/L (ref 96–112)
Potassium: 3.8 mEq/L (ref 3.5–5.1)
Sodium: 136 mEq/L (ref 135–145)

## 2013-06-13 LAB — LIPID PANEL
Cholesterol: 152 mg/dL (ref 0–200)
HDL: 48.2 mg/dL (ref >39.00)
LDL Cholesterol: 94 mg/dL (ref 0–99)
Total CHOL/HDL Ratio: 3
Triglycerides: 47 mg/dL (ref 0.0–149.0)
VLDL: 9.4 mg/dL (ref 0.0–40.0)

## 2013-06-13 LAB — TSH: TSH: 1.1 u[IU]/mL (ref 0.35–5.50)

## 2013-06-13 NOTE — Assessment & Plan Note (Signed)
nonfocal neurological exam, no red flags today.  Continue to monitor, if persistent, consider head imaging.

## 2013-06-13 NOTE — Assessment & Plan Note (Signed)
20lb weight gain in last 4 months, unexpected.  Has not changed routine - stays active and endorses healthy diet choices. Unclear etiology. Return fasting for blood work, check TSH then.

## 2013-06-14 LAB — PROLACTIN: Prolactin: 9.4 ng/mL

## 2013-06-16 ENCOUNTER — Telehealth: Payer: Self-pay

## 2013-06-16 NOTE — Telephone Encounter (Signed)
Pt left v/m requesting cb when recent lab results are available.

## 2013-06-17 NOTE — Telephone Encounter (Signed)
Spoke with patient.

## 2013-08-14 ENCOUNTER — Other Ambulatory Visit: Payer: Self-pay

## 2013-08-31 ENCOUNTER — Encounter: Payer: Self-pay | Admitting: Family Medicine

## 2013-08-31 DIAGNOSIS — R635 Abnormal weight gain: Secondary | ICD-10-CM

## 2013-08-31 DIAGNOSIS — R5383 Other fatigue: Secondary | ICD-10-CM

## 2013-09-01 NOTE — Telephone Encounter (Signed)
Please see Mychart message.

## 2013-09-09 ENCOUNTER — Encounter: Payer: Self-pay | Admitting: Family Medicine

## 2013-09-09 DIAGNOSIS — R635 Abnormal weight gain: Secondary | ICD-10-CM

## 2013-09-16 ENCOUNTER — Other Ambulatory Visit (INDEPENDENT_AMBULATORY_CARE_PROVIDER_SITE_OTHER): Payer: BC Managed Care – PPO

## 2013-09-16 DIAGNOSIS — R635 Abnormal weight gain: Secondary | ICD-10-CM

## 2013-09-16 DIAGNOSIS — R5383 Other fatigue: Secondary | ICD-10-CM

## 2013-09-16 DIAGNOSIS — R5381 Other malaise: Secondary | ICD-10-CM

## 2013-09-16 LAB — T4, FREE: Free T4: 0.99 ng/dL (ref 0.60–1.60)

## 2013-10-06 ENCOUNTER — Telehealth: Payer: Self-pay | Admitting: Family Medicine

## 2013-10-06 DIAGNOSIS — R635 Abnormal weight gain: Secondary | ICD-10-CM

## 2013-10-06 NOTE — Telephone Encounter (Signed)
I have not found a cause for her weight gain. If pt desires to further pursue, will refer to endo.

## 2013-10-06 NOTE — Telephone Encounter (Signed)
Pt called and requesting referral to endo to Dr. Debara Pickett @ [facility]. Best number to reach pt is [phone].

## 2013-10-09 DIAGNOSIS — R635 Abnormal weight gain: Secondary | ICD-10-CM

## 2013-10-09 HISTORY — DX: Abnormal weight gain: R63.5

## 2014-01-21 ENCOUNTER — Ambulatory Visit
Admission: RE | Admit: 2014-01-21 | Discharge: 2014-01-21 | Disposition: A | Discharge status: Home / Self Care | Payer: BC Managed Care – PPO | Source: Ambulatory Visit | Attending: Internal Medicine | Admitting: Internal Medicine

## 2014-01-21 ENCOUNTER — Other Ambulatory Visit: Payer: Self-pay | Admitting: Internal Medicine

## 2014-01-21 DIAGNOSIS — Q688 Other specified congenital musculoskeletal deformities: Secondary | ICD-10-CM

## 2014-02-01 ENCOUNTER — Encounter: Payer: Self-pay | Admitting: Family Medicine

## 2014-02-24 ENCOUNTER — Encounter: Payer: Self-pay | Admitting: Family Medicine

## 2014-07-24 ENCOUNTER — Other Ambulatory Visit: Payer: Self-pay

## 2014-08-10 ENCOUNTER — Encounter: Payer: Self-pay | Admitting: Family Medicine

## 2014-11-02 IMAGING — CR DG CLAVICLE*L*
2 series · 2 of 2 positions shown · non-contrast
Comparison: DG CLAVICLE*R* dated 01/21/2014

CLINICAL DATA: Congenital deformity of the right clavicle.

EXAM:
LEFT CLAVICLE - 2+ VIEWS

[w clavicle ap left]
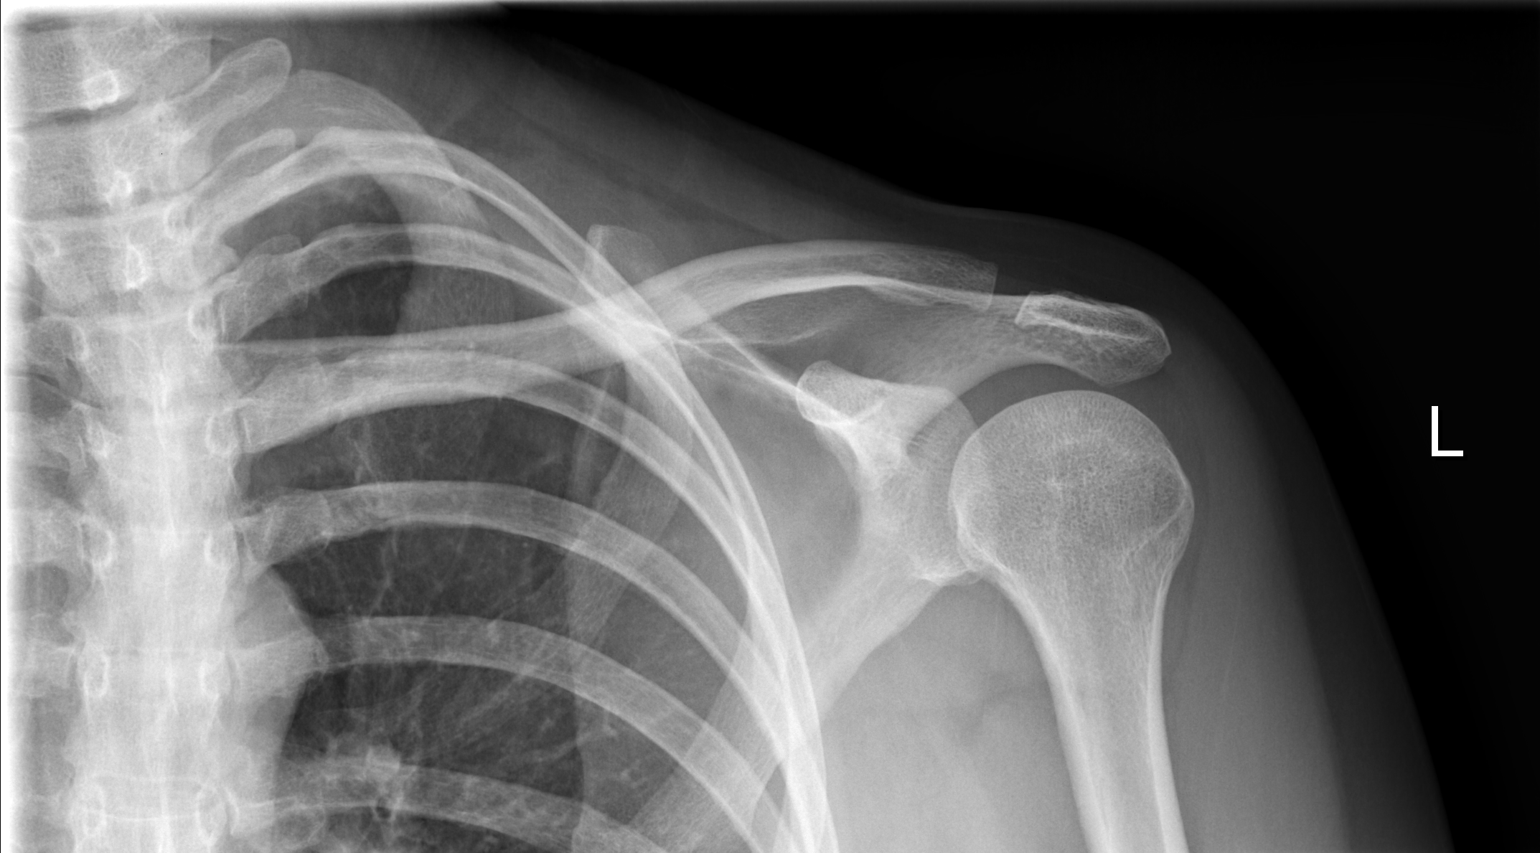

[w clavicle tangential left]
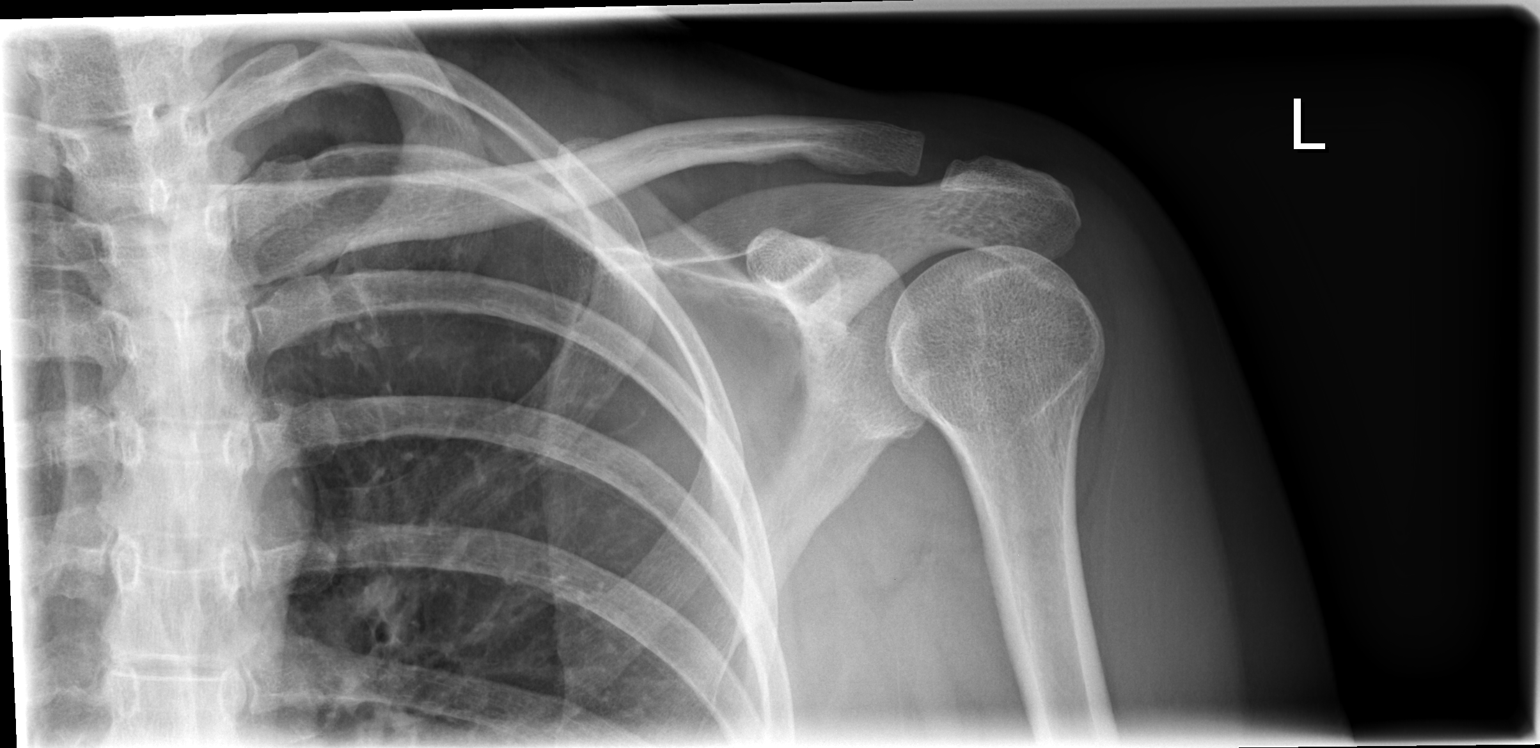

[2 of 2 positions shown; findings below may reference images not displayed]

FINDINGS: Left clavicle, acromioclavicular joint and glenohumeral joint are
unremarkable. Visualized portion of the left chest is clear.
IMPRESSION: Negative.

## 2016-04-17 ENCOUNTER — Encounter: Payer: Self-pay | Admitting: Internal Medicine

## 2016-08-03 LAB — HM PAP SMEAR

## 2016-08-04 ENCOUNTER — Encounter: Payer: Self-pay | Admitting: Family Medicine

## 2019-07-08 ENCOUNTER — Encounter: Payer: Self-pay | Admitting: Family Medicine

## 2019-07-08 DIAGNOSIS — L409 Psoriasis, unspecified: Secondary | ICD-10-CM | POA: Insufficient documentation

## 2019-12-14 ENCOUNTER — Ambulatory Visit: Payer: Self-pay | Attending: Internal Medicine

## 2019-12-14 DIAGNOSIS — Z23 Encounter for immunization: Secondary | ICD-10-CM | POA: Insufficient documentation

## 2019-12-14 NOTE — Progress Notes (Signed)
   Covid-19 Vaccination Clinic  Name:  IZELLA YBANEZ    MRN: [id number] DOB: 08/05/1976  12/14/2019  Ms. Mester was observed post Covid-19 immunization for 15 minutes without incident. She was provided with Vaccine Information Sheet and instruction to access the V-Safe system.   Ms. Saulsbury was instructed to call 911 with any severe reactions post vaccine: Marland Kitchen Difficulty breathing  . Swelling of face and throat  . A fast heartbeat  . A bad rash all over body  . Dizziness and weakness   Immunizations Administered    Name Date Dose VIS Date Route   Pfizer COVID-19 Vaccine 12/14/2019 12:14 PM 0.3 mL 09/19/2019 Intramuscular   Manufacturer: [employer], [employer]   Lot: [id number]   NDC: [address]-1

## 2020-01-07 ENCOUNTER — Ambulatory Visit: Payer: Self-pay | Attending: Internal Medicine

## 2020-01-07 DIAGNOSIS — Z23 Encounter for immunization: Secondary | ICD-10-CM

## 2020-01-07 NOTE — Progress Notes (Signed)
   Covid-19 Vaccination Clinic  Name:  Linda Kemp    MRN: [id number] DOB: [birth year]  01/07/2020  [name] was observed post Covid-19 immunization for 15 minutes without incident. She was provided with Vaccine Information Sheet and instruction to access the V-Safe system.   [name] was instructed to call 911 with any severe reactions post vaccine: Marland Kitchen Difficulty breathing  . Swelling of face and throat  . A fast heartbeat  . A bad rash all over body  . Dizziness and weakness   Immunizations Administered    Name Date Dose VIS Date Route   Pfizer COVID-19 Vaccine 01/07/2020  8:35 AM 0.3 mL 09/19/2019 Intramuscular   Manufacturer: [employer], [employer]   Lot: [id number]   NDC: [address]-2

## 2020-06-10 DIAGNOSIS — Z6823 Body mass index (BMI) 23.0-23.9, adult: Secondary | ICD-10-CM | POA: Diagnosis not present

## 2020-06-10 DIAGNOSIS — Z01419 Encounter for gynecological examination (general) (routine) without abnormal findings: Secondary | ICD-10-CM | POA: Diagnosis not present

## 2020-06-10 DIAGNOSIS — Z1231 Encounter for screening mammogram for malignant neoplasm of breast: Secondary | ICD-10-CM | POA: Diagnosis not present

## 2020-08-05 DIAGNOSIS — H903 Sensorineural hearing loss, bilateral: Secondary | ICD-10-CM | POA: Diagnosis not present

## 2021-07-08 DIAGNOSIS — Z01419 Encounter for gynecological examination (general) (routine) without abnormal findings: Secondary | ICD-10-CM | POA: Diagnosis not present

## 2021-07-08 DIAGNOSIS — Z6823 Body mass index (BMI) 23.0-23.9, adult: Secondary | ICD-10-CM | POA: Diagnosis not present

## 2021-07-08 DIAGNOSIS — Z1231 Encounter for screening mammogram for malignant neoplasm of breast: Secondary | ICD-10-CM | POA: Diagnosis not present

## 2021-08-19 DIAGNOSIS — H903 Sensorineural hearing loss, bilateral: Secondary | ICD-10-CM | POA: Diagnosis not present

## 2021-11-21 ENCOUNTER — Telehealth: Payer: Self-pay

## 2021-11-21 NOTE — Telephone Encounter (Signed)
See below

## 2021-11-21 NOTE — Telephone Encounter (Signed)
Pt returned call back about rescheduling a NEW PT for 11/24/21. Pt was not happy about having to wait another 4 months when she originally waited for this 2/16 appt since 06/10/2021.   Pt is wanting to know if she can be rescheduled sooner than Dr. Okey Dupre first available appt for New Pt which is 03/27/22.  Please advise. I advised pt I would give her a call back once I asked the provider if an earlier appt could be made.

## 2021-11-22 NOTE — Telephone Encounter (Signed)
We absolutely reschedule patients moved due to my schedule in real time. We could do 11/22/21 at 11:00 or 11/23/21 at 11:00. Otherwise we could do any day next week (wed-fri) per patient preference.

## 2021-11-23 NOTE — Telephone Encounter (Signed)
See my chart message

## 2021-11-24 ENCOUNTER — Ambulatory Visit: Payer: BC Managed Care – PPO | Admitting: Internal Medicine

## 2022-03-27 ENCOUNTER — Encounter: Payer: Self-pay | Admitting: Internal Medicine

## 2022-03-27 ENCOUNTER — Ambulatory Visit (INDEPENDENT_AMBULATORY_CARE_PROVIDER_SITE_OTHER): Payer: BC Managed Care – PPO | Admitting: Internal Medicine

## 2022-03-27 VITALS — BP 122/72 | HR 67 | Temp 98.0°F | Ht 67.0 in | Wt 160.2 lb

## 2022-03-27 DIAGNOSIS — Z1211 Encounter for screening for malignant neoplasm of colon: Secondary | ICD-10-CM | POA: Diagnosis not present

## 2022-03-27 DIAGNOSIS — Z0000 Encounter for general adult medical examination without abnormal findings: Secondary | ICD-10-CM | POA: Diagnosis not present

## 2022-03-27 DIAGNOSIS — Z136 Encounter for screening for cardiovascular disorders: Secondary | ICD-10-CM | POA: Diagnosis not present

## 2022-03-27 LAB — CBC
HCT: 38.6 % (ref 36.0–46.0)
Hemoglobin: 13.1 g/dL (ref 12.0–15.0)
MCHC: 33.9 g/dL (ref 30.0–36.0)
MCV: 89.2 fl (ref 78.0–100.0)
Platelets: 192 10*3/uL (ref 150.0–400.0)
RBC: 4.33 Mil/uL (ref 3.87–5.11)
RDW: 14 % (ref 11.5–15.5)
WBC: 6.3 10*3/uL (ref 4.0–10.5)

## 2022-03-27 LAB — COMPREHENSIVE METABOLIC PANEL
ALT: 9 U/L (ref 0–35)
AST: 14 U/L (ref 0–37)
Albumin: 4.1 g/dL (ref 3.5–5.2)
Alkaline Phosphatase: 42 U/L (ref 39–117)
BUN: 19 mg/dL (ref 6–23)
CO2: 25 mEq/L (ref 19–32)
Calcium: 9.1 mg/dL (ref 8.4–10.5)
Chloride: 106 mEq/L (ref 96–112)
Creatinine, Ser: 0.68 mg/dL (ref 0.40–1.20)
GFR: 104.53 mL/min (ref >60.00)
Glucose, Bld: 95 mg/dL (ref 70–99)
Potassium: 4.1 mEq/L (ref 3.5–5.1)
Sodium: 137 mEq/L (ref 135–145)
Total Bilirubin: 0.4 mg/dL (ref 0.2–1.2)
Total Protein: 6.9 g/dL (ref 6.0–8.3)

## 2022-03-27 LAB — LIPID PANEL
Cholesterol: 150 mg/dL (ref 0–200)
HDL: 49.9 mg/dL (ref >39.00)
LDL Cholesterol: 88 mg/dL (ref 0–99)
NonHDL: 100.28
Total CHOL/HDL Ratio: 3
Triglycerides: 60 mg/dL (ref 0.0–149.0)
VLDL: 12 mg/dL (ref 0.0–40.0)

## 2022-03-27 NOTE — Assessment & Plan Note (Signed)
Flu shot yearly. Covid-19 counseled. Tetanus declines. Colonoscopy referral done. Mammogram up to date getting records, pap smear up to date getting records. Counseled about sun safety and mole surveillance. Counseled about the dangers of distracted driving. Given 10 year screening recommendations.

## 2022-03-27 NOTE — Progress Notes (Signed)
   Subjective:   Patient ID: Linda Kemp, female    DOB: 1976/07/04, 46 y.o.   MRN: [id number]  HPI The patient is a new 46 YO female coming in for physical.  PMH, FMH, social history reviewed and updated  Review of Systems  Constitutional: Negative.   HENT: Negative.    Eyes: Negative.   Respiratory:  Negative for cough, chest tightness and shortness of breath.   Cardiovascular:  Negative for chest pain, palpitations and leg swelling.  Gastrointestinal:  Negative for abdominal distention, abdominal pain, constipation, diarrhea, nausea and vomiting.  Musculoskeletal: Negative.   Skin: Negative.   Neurological: Negative.   Psychiatric/Behavioral: Negative.      Objective:  Physical Exam Constitutional:      Appearance: She is well-developed.  HENT:     Head: Normocephalic and atraumatic.  Cardiovascular:     Rate and Rhythm: Normal rate and regular rhythm.  Pulmonary:     Effort: Pulmonary effort is normal. No respiratory distress.     Breath sounds: Normal breath sounds. No wheezing or rales.  Abdominal:     General: Bowel sounds are normal. There is no distension.     Palpations: Abdomen is soft.     Tenderness: There is no abdominal tenderness. There is no rebound.  Musculoskeletal:     Cervical back: Normal range of motion.  Skin:    General: Skin is warm and dry.  Neurological:     Mental Status: She is alert and oriented to person, place, and time.     Coordination: Coordination normal.     Vitals:   03/27/22 0803  BP: 122/72  Pulse: 67  Temp: 98 F (36.7 C)  TempSrc: Oral  SpO2: 97%  Weight: 160 lb 4 oz (72.7 kg)  Height: 5\' 7"  (1.702 m)    Assessment & Plan:

## 2022-04-17 ENCOUNTER — Encounter: Payer: Self-pay | Admitting: Gastroenterology

## 2022-05-12 ENCOUNTER — Ambulatory Visit (AMBULATORY_SURGERY_CENTER): Payer: Self-pay | Admitting: *Deleted

## 2022-05-12 VITALS — Ht 67.0 in | Wt 160.6 lb

## 2022-05-12 DIAGNOSIS — Z1211 Encounter for screening for malignant neoplasm of colon: Secondary | ICD-10-CM

## 2022-05-12 MED ORDER — NA SULFATE-K SULFATE-MG SULF 17.5-3.13-1.6 GM/177ML PO SOLN: 1.0000 | Freq: Once | ORAL | 0 refills | Status: AC

## 2022-05-12 NOTE — Progress Notes (Signed)
No egg or soy allergy known to patient  No issues known to pt with past sedation with any surgeries or procedures Patient denies ever being told they had issues or difficulty with intubation  No FH of Malignant Hyperthermia Pt is not on diet pills Pt is not on home 02  Pt is not on blood thinners  Pt denies issues with constipation  No A fib or A flutter Have any cardiac testing pending--NO Pt instructed to use [email or web address] or GoodRx for a price reduction on prep   

## 2022-05-25 ENCOUNTER — Encounter: Payer: Self-pay | Admitting: Gastroenterology

## 2022-06-02 ENCOUNTER — Encounter: Payer: Self-pay | Admitting: Gastroenterology

## 2022-06-02 ENCOUNTER — Ambulatory Visit (AMBULATORY_SURGERY_CENTER): Payer: BC Managed Care – PPO | Admitting: Gastroenterology

## 2022-06-02 VITALS — BP 110/69 | HR 70 | Temp 98.0°F | Resp 19 | Ht 67.0 in | Wt 160.6 lb

## 2022-06-02 DIAGNOSIS — K5730 Diverticulosis of large intestine without perforation or abscess without bleeding: Secondary | ICD-10-CM

## 2022-06-02 DIAGNOSIS — K640 First degree hemorrhoids: Secondary | ICD-10-CM

## 2022-06-02 DIAGNOSIS — Z1211 Encounter for screening for malignant neoplasm of colon: Secondary | ICD-10-CM

## 2022-06-02 MED ORDER — SODIUM CHLORIDE 0.9 % IV SOLN: 500.0000 mL | Freq: Once | INTRAVENOUS | Status: DC

## 2022-06-02 NOTE — Op Note (Signed)
[facility] Patient Name: Linda Kemp Procedure Date: 06/02/2022 7:58 AM MRN: [id number] Endoscopist: Doristine Locks , MD Age: 46 Referring MD:  Date of Birth: Feb 11, 1976 Gender: Female Account #: [id number] Procedure:                Colonoscopy Indications:              Screening for colorectal malignant neoplasm, This                            is the patient's first colonoscopy Medicines:                Monitored Anesthesia Care Procedure:                Pre-Anesthesia Assessment:                           - Prior to the procedure, a History and Physical                            was performed, and patient medications and                            allergies were reviewed. The patient's tolerance of                            previous anesthesia was also reviewed. The risks                            and benefits of the procedure and the sedation                            options and risks were discussed with the patient.                            All questions were answered, and informed consent                            was obtained. Prior Anticoagulants: The patient has                            taken no previous anticoagulant or antiplatelet                            agents. ASA Grade Assessment: I - A normal, healthy                            patient. After reviewing the risks and benefits,                            the patient was deemed in satisfactory condition to                            undergo the procedure.  After obtaining informed consent, the colonoscope                            was passed under direct vision. Throughout the                            procedure, the patient's blood pressure, pulse, and                            oxygen saturations were monitored continuously. The                            Olympus CF-HQ190L ([id number]) Colonoscope was                            introduced through the anus and  advanced to the the                            cecum, identified by appendiceal orifice and                            ileocecal valve. The colonoscopy was performed                            without difficulty. The patient tolerated the                            procedure well. The quality of the bowel                            preparation was good. The ileocecal valve,                            appendiceal orifice, and rectum were photographed. Scope In: 8:01:12 AM Scope Out: 8:19:35 AM Scope Withdrawal Time: 0 hours 12 minutes 1 second  Total Procedure Duration: 0 hours 18 minutes 23 seconds  Findings:                 Skin tags were found on perianal exam.                           A few small-mouthed diverticula were found in the                            sigmoid colon.                           The sigmoid colon and hepatic flexure were                            moderately tortuous.                           The exam was otherwise normal throughout remainder  of the colon.                           Non-bleeding internal hemorrhoids were found during                            retroflexion. The hemorrhoids were small. Complications:            No immediate complications. Estimated Blood Loss:     Estimated blood loss: none. Impression:               - Perianal skin tags found on perianal exam.                           - Diverticulosis in the sigmoid colon.                           - Tortuous colon.                           - Non-bleeding internal hemorrhoids.                           - No specimens collected. Recommendation:           - Patient has a contact number available for                            emergencies. The signs and symptoms of potential                            delayed complications were discussed with the                            patient. Return to normal activities tomorrow.                            Written discharge  instructions were provided to the                            patient.                           - Resume previous diet.                           - Continue present medications.                           - Repeat colonoscopy in 10 years for screening                            purposes.                           - Return to GI clinic PRN. Doristine Locks, MD 06/02/2022 8:25:29 AM

## 2022-06-02 NOTE — Patient Instructions (Signed)
Impression/Recommendations:  Diverticulosis and hemorrhoid handouts given to patient.  Resume previous diet. Continue present medications.  Repeat colonoscopy in 10 years for screening purposes.  Return to GI clinic as needed.  YOU HAD AN ENDOSCOPIC PROCEDURE TODAY AT THE [facility]:   Refer to the procedure report that was given to you for any specific questions about what was found during the examination.  If the procedure report does not answer your questions, please call your gastroenterologist to clarify.  If you requested that your care partner not be given the details of your procedure findings, then the procedure report has been included in a sealed envelope for you to review at your convenience later.  YOU SHOULD EXPECT: Some feelings of bloating in the abdomen. Passage of more gas than usual.  Walking can help get rid of the air that was put into your GI tract during the procedure and reduce the bloating. If you had a lower endoscopy (such as a colonoscopy or flexible sigmoidoscopy) you may notice spotting of blood in your stool or on the toilet paper. If you underwent a bowel prep for your procedure, you may not have a normal bowel movement for a few days.  Please Note:  You might notice some irritation and congestion in your nose or some drainage.  This is from the oxygen used during your procedure.  There is no need for concern and it should clear up in a day or so.  SYMPTOMS TO REPORT IMMEDIATELY:  Following lower endoscopy (colonoscopy or flexible sigmoidoscopy):  Excessive amounts of blood in the stool  Significant tenderness or worsening of abdominal pains  Swelling of the abdomen that is new, acute  Fever of 100F or higher  For urgent or emergent issues, a gastroenterologist can be reached at any hour by calling [phone]. Do not use MyChart messaging for urgent concerns.    DIET:  We do recommend a small meal at first, but then you may proceed to  your regular diet.  Drink plenty of fluids but you should avoid alcoholic beverages for 24 hours.  ACTIVITY:  You should plan to take it easy for the rest of today and you should NOT DRIVE or use heavy machinery until tomorrow (because of the sedation medicines used during the test).    FOLLOW UP: Our staff will call the number listed on your records the next business day following your procedure.  We will call around 7:15- 8:00 am to check on you and address any questions or concerns that you may have regarding the information given to you following your procedure. If we do not reach you, we will leave a message.  If you develop any symptoms (ie: fever, flu-like symptoms, shortness of breath, cough etc.) before then, please call [phone].  If you test positive for Covid 19 in the 2 weeks post procedure, please call and report this information to [place].    If any biopsies were taken you will be contacted by phone or by letter within the next 1-3 weeks.  Please call us at [phone] if you have not heard about the biopsies in 3 weeks.    SIGNATURES/CONFIDENTIALITY: You and/or your care partner have signed paperwork which will be entered into your electronic medical record.  These signatures attest to the fact that that the information above on your After Visit Summary has been reviewed and is understood.  Full responsibility of the confidentiality of this discharge information lies with you and/or your care-partner.

## 2022-06-02 NOTE — Progress Notes (Signed)
Report to PACU, RN, vss, BBS= Clear.  

## 2022-06-02 NOTE — Progress Notes (Signed)
VS completed by AS.  Pt's states no medical or surgical changes since previsit or office visit.  

## 2022-06-02 NOTE — Progress Notes (Signed)
GASTROENTEROLOGY PROCEDURE H&P NOTE   Primary Care Physician: Myrlene Broker, MD    Reason for Procedure:  Colon Cancer screening  Plan:    Colonoscopy  Patient is appropriate for endoscopic procedure(s) in the ambulatory ([facility]) setting.  The nature of the procedure, as well as the risks, benefits, and alternatives were carefully and thoroughly reviewed with the patient. Ample time for discussion and questions allowed. The patient understood, was satisfied, and agreed to proceed.     HPI: Linda Kemp is a 46 y.o. female who presents for colonoscopy for routine Colon Cancer screening.  No active GI symptoms.    Family history notable for maternal aunt with colon cancer, father with colon polyps.    Past Medical History:  Diagnosis Date   Abnormal weight gain 2015   s/p unrevealing endo workup Sharl Ma)   Allergy    SEASONAL   GERD (gastroesophageal reflux disease)    Hearing loss    WEAR HEARING AIDS BILATERAL   Psoriasis    Seasonal allergies    controlled OTC    Past Surgical History:  Procedure Laterality Date   CHOLECYSTECTOMY  2003   WISDOM TOOTH EXTRACTION      Prior to Admission medications   Medication Sig Start Date End Date Taking? Authorizing Provider  levocetirizine (XYZAL ALLERGY 24HR) 5 MG tablet Xyzal   Yes [provider]    Current Outpatient Medications  Medication Sig Dispense Refill   levocetirizine (XYZAL ALLERGY 24HR) 5 MG tablet Xyzal     Current Facility-Administered Medications  Medication Dose Route Frequency Provider Last Rate Last Admin   0.9 %  sodium chloride infusion  500 mL Intravenous Once Laetitia Schnepf V, DO        Allergies as of 06/02/2022   (No Known Allergies)    Family History  Problem Relation Age of Onset   Cancer Mother 61       breast   Colon polyps Father    Hypertension Father    Hyperlipidemia Father    Colon cancer Maternal Aunt    Throat cancer Maternal Grandmother     Prostate cancer Maternal Grandfather    CAD Paternal Grandfather    Heart attack Paternal Grandfather    Crohn's disease Neg Hx    Esophageal cancer Neg Hx    Rectal cancer Neg Hx    Stomach cancer Neg Hx     Social History   Socioeconomic History   Marital status: Married    Spouse name: Not on file   Number of children: 3   Years of education: Not on file   Highest education level: Not on file  Occupational History   Occupation: Stay at home mom  Tobacco Use   Smoking status: Never    Passive exposure: Never   Smokeless tobacco: Never  Vaping Use   Vaping Use: Never used  Substance and Sexual Activity   Alcohol use: No   Drug use: No   Sexual activity: Yes    Partners: Male    Birth control/protection: None    Comment: married   Other Topics Concern   Not on file  Social History Narrative   Stay at home mom      3 children (2002,2004,2007), 1 dog         Social Determinants of Health   Financial Resource Strain: Not on file  Food Insecurity: Not on file  Transportation Needs: Not on file  Physical Activity: Not on file  Stress: Not  on file  Social Connections: Not on file  Intimate Partner Violence: Not on file    Physical Exam: Vital signs in last 24 hours: @BP  119/71   Pulse 73   Temp 98 F (36.7 C) (Temporal)   Ht 5\' 7"  (1.702 m)   Wt 160 lb 9.6 oz (72.8 kg)   LMP 04/30/2022 (Exact Date)   SpO2 100%   BMI 25.15 kg/m  GEN: NAD EYE: Sclerae anicteric ENT: MMM CV: Non-tachycardic Pulm: CTA b/l GI: Soft, NT/ND NEURO:  Alert & Oriented x 3   , DO [facility]   06/02/2022 7:51 AM

## 2022-06-05 ENCOUNTER — Telehealth: Payer: Self-pay | Admitting: *Deleted

## 2022-06-05 DIAGNOSIS — H903 Sensorineural hearing loss, bilateral: Secondary | ICD-10-CM | POA: Diagnosis not present

## 2022-06-05 NOTE — Telephone Encounter (Signed)
  Follow up Call-     06/02/2022    7:17 AM  Call back number  Post procedure Call Back phone  # [phone]  Permission to leave phone message Yes    Follow up phone call. No answer at number given.  Left message on voicemail.

## 2023-03-30 ENCOUNTER — Encounter: Payer: BC Managed Care – PPO | Admitting: Internal Medicine

## 2023-04-02 ENCOUNTER — Encounter: Payer: BC Managed Care – PPO | Admitting: Internal Medicine

## 2023-04-30 ENCOUNTER — Ambulatory Visit (INDEPENDENT_AMBULATORY_CARE_PROVIDER_SITE_OTHER): Payer: BC Managed Care – PPO | Admitting: Internal Medicine

## 2023-04-30 ENCOUNTER — Other Ambulatory Visit (HOSPITAL_COMMUNITY)
Admission: RE | Admit: 2023-04-30 | Discharge: 2023-04-30 | Disposition: A | Discharge status: Home / Self Care | Payer: BC Managed Care – PPO | Source: Ambulatory Visit | Attending: Internal Medicine | Admitting: Internal Medicine

## 2023-04-30 ENCOUNTER — Encounter: Payer: Self-pay | Admitting: Internal Medicine

## 2023-04-30 VITALS — BP 120/80 | HR 70 | Temp 98.0°F | Ht 67.0 in | Wt 166.0 lb

## 2023-04-30 DIAGNOSIS — B9689 Other specified bacterial agents as the cause of diseases classified elsewhere: Secondary | ICD-10-CM | POA: Insufficient documentation

## 2023-04-30 DIAGNOSIS — Z0000 Encounter for general adult medical examination without abnormal findings: Secondary | ICD-10-CM | POA: Insufficient documentation

## 2023-04-30 DIAGNOSIS — J3089 Other allergic rhinitis: Secondary | ICD-10-CM | POA: Diagnosis not present

## 2023-04-30 DIAGNOSIS — B3731 Acute candidiasis of vulva and vagina: Secondary | ICD-10-CM | POA: Diagnosis not present

## 2023-04-30 DIAGNOSIS — N898 Other specified noninflammatory disorders of vagina: Secondary | ICD-10-CM

## 2023-04-30 DIAGNOSIS — N760 Acute vaginitis: Secondary | ICD-10-CM | POA: Diagnosis not present

## 2023-04-30 LAB — COMPREHENSIVE METABOLIC PANEL
ALT: 9 U/L (ref 0–35)
AST: 13 U/L (ref 0–37)
Albumin: 4.3 g/dL (ref 3.5–5.2)
Alkaline Phosphatase: 48 U/L (ref 39–117)
BUN: 14 mg/dL (ref 6–23)
CO2: 23 mEq/L (ref 19–32)
Calcium: 9.6 mg/dL (ref 8.4–10.5)
Chloride: 107 mEq/L (ref 96–112)
Creatinine, Ser: 0.71 mg/dL (ref 0.40–1.20)
GFR: 101.27 mL/min (ref >60.00)
Glucose, Bld: 96 mg/dL (ref 70–99)
Potassium: 4 mEq/L (ref 3.5–5.1)
Sodium: 139 mEq/L (ref 135–145)
Total Bilirubin: 0.5 mg/dL (ref 0.2–1.2)
Total Protein: 7.2 g/dL (ref 6.0–8.3)

## 2023-04-30 LAB — LIPID PANEL
Cholesterol: 167 mg/dL (ref 0–200)
HDL: 54.5 mg/dL (ref >39.00)
LDL Cholesterol: 103 mg/dL — ABNORMAL HIGH (ref 0–99)
NonHDL: 112.25
Total CHOL/HDL Ratio: 3
Triglycerides: 44 mg/dL (ref 0.0–149.0)
VLDL: 8.8 mg/dL (ref 0.0–40.0)

## 2023-04-30 LAB — CBC
HCT: 39.4 % (ref 36.0–46.0)
Hemoglobin: 13 g/dL (ref 12.0–15.0)
MCHC: 33 g/dL (ref 30.0–36.0)
MCV: 89.3 fl (ref 78.0–100.0)
Platelets: 241 10*3/uL (ref 150.0–400.0)
RBC: 4.41 Mil/uL (ref 3.87–5.11)
RDW: 14.4 % (ref 11.5–15.5)
WBC: 5.6 10*3/uL (ref 4.0–10.5)

## 2023-04-30 MED ORDER — FLUTICASONE PROPIONATE 50 MCG/ACT NA SUSP: 2.0000 | Freq: Every day | NASAL | 6 refills | Status: DC

## 2023-04-30 NOTE — Progress Notes (Unsigned)
   Subjective:   Patient ID: Linda Kemp, female    DOB: [birth year], 47 y.o.   MRN: [id number]  HPI The patient is here for physical.  PMH, [facility], social history reviewed and updated  Review of Systems  Objective:  Physical Exam  Vitals:   04/30/23 0848  BP: 120/80  Pulse: 70  Temp: 98 F (36.7 C)  TempSrc: Oral  SpO2: 99%  Weight: 166 lb (75.3 kg)  Height: 5\' 7"  (1.702 m)    Assessment & Plan:

## 2023-05-01 ENCOUNTER — Other Ambulatory Visit: Payer: Self-pay | Admitting: Internal Medicine

## 2023-05-01 LAB — CERVICOVAGINAL ANCILLARY ONLY
Bacterial Vaginitis (gardnerella): POSITIVE — AB
Candida Glabrata: NEGATIVE
Candida Vaginitis: POSITIVE — AB
Comment: NEGATIVE
Comment: NEGATIVE
Comment: NEGATIVE
Comment: NEGATIVE
Trichomonas: NEGATIVE

## 2023-05-01 MED ORDER — METRONIDAZOLE 500 MG PO TABS: 500.0000 mg | ORAL_TABLET | Freq: Three times a day (TID) | ORAL | 0 refills | Status: AC

## 2023-05-01 MED ORDER — FLUCONAZOLE 150 MG PO TABS: 150.0000 mg | ORAL_TABLET | Freq: Once | ORAL | 0 refills | Status: AC

## 2023-05-02 ENCOUNTER — Encounter: Payer: Self-pay | Admitting: Internal Medicine

## 2023-05-02 DIAGNOSIS — N898 Other specified noninflammatory disorders of vagina: Secondary | ICD-10-CM | POA: Insufficient documentation

## 2023-05-02 DIAGNOSIS — J309 Allergic rhinitis, unspecified: Secondary | ICD-10-CM | POA: Insufficient documentation

## 2023-05-02 NOTE — Assessment & Plan Note (Signed)
Taking xyzal and not well controlled. Rx flonase to try as well.

## 2023-05-02 NOTE — Assessment & Plan Note (Signed)
No new partner. Self swab done for BV, candida and trich. Treat as appropriate.

## 2023-05-02 NOTE — Assessment & Plan Note (Signed)
Flu shot yearly. Tetanus declines. Colonoscopy up to date. Mammogram up to date with gyn, pap smear up to date with gyn. Counseled about sun safety and mole surveillance. Counseled about the dangers of distracted driving. Given 10 year screening recommendations.

## 2023-05-14 LAB — HM MAMMOGRAPHY

## 2023-05-15 ENCOUNTER — Ambulatory Visit
Admission: RE | Admit: 2023-05-15 | Discharge: 2023-05-15 | Disposition: A | Discharge status: Home / Self Care | Payer: BC Managed Care – PPO | Source: Ambulatory Visit | Attending: Internal Medicine | Admitting: Internal Medicine

## 2023-05-15 DIAGNOSIS — Z1231 Encounter for screening mammogram for malignant neoplasm of breast: Secondary | ICD-10-CM | POA: Diagnosis not present

## 2023-05-15 DIAGNOSIS — Z0000 Encounter for general adult medical examination without abnormal findings: Secondary | ICD-10-CM

## 2023-05-18 ENCOUNTER — Encounter: Payer: Self-pay | Admitting: Internal Medicine

## 2023-06-26 DIAGNOSIS — M25511 Pain in right shoulder: Secondary | ICD-10-CM | POA: Diagnosis not present

## 2024-05-09 ENCOUNTER — Ambulatory Visit: Admitting: Internal Medicine

## 2024-05-09 ENCOUNTER — Encounter: Payer: Self-pay | Admitting: Internal Medicine

## 2024-05-09 VITALS — BP 100/68 | HR 63 | Temp 98.0°F | Ht 67.0 in | Wt 165.0 lb

## 2024-05-09 DIAGNOSIS — Z13220 Encounter for screening for lipoid disorders: Secondary | ICD-10-CM | POA: Diagnosis not present

## 2024-05-09 DIAGNOSIS — J3089 Other allergic rhinitis: Secondary | ICD-10-CM

## 2024-05-09 DIAGNOSIS — Z131 Encounter for screening for diabetes mellitus: Secondary | ICD-10-CM

## 2024-05-09 DIAGNOSIS — Z124 Encounter for screening for malignant neoplasm of cervix: Secondary | ICD-10-CM

## 2024-05-09 DIAGNOSIS — Z0000 Encounter for general adult medical examination without abnormal findings: Secondary | ICD-10-CM

## 2024-05-09 LAB — CBC
HCT: 40.9 % (ref 36.0–46.0)
Hemoglobin: 13.5 g/dL (ref 12.0–15.0)
MCHC: 33.1 g/dL (ref 30.0–36.0)
MCV: 88.7 fl (ref 78.0–100.0)
Platelets: 280 K/uL (ref 150.0–400.0)
RBC: 4.62 Mil/uL (ref 3.87–5.11)
RDW: 14.1 % (ref 11.5–15.5)
WBC: 5 K/uL (ref 4.0–10.5)

## 2024-05-09 LAB — COMPREHENSIVE METABOLIC PANEL WITH GFR
ALT: 20 U/L (ref 0–35)
AST: 20 U/L (ref 0–37)
Albumin: 4.3 g/dL (ref 3.5–5.2)
Alkaline Phosphatase: 49 U/L (ref 39–117)
BUN: 17 mg/dL (ref 6–23)
CO2: 25 meq/L (ref 19–32)
Calcium: 9.4 mg/dL (ref 8.4–10.5)
Chloride: 105 meq/L (ref 96–112)
Creatinine, Ser: 0.69 mg/dL (ref 0.40–1.20)
GFR: 102.63 mL/min (ref >60.00)
Glucose, Bld: 93 mg/dL (ref 70–99)
Potassium: 4.2 meq/L (ref 3.5–5.1)
Sodium: 137 meq/L (ref 135–145)
Total Bilirubin: 0.4 mg/dL (ref 0.2–1.2)
Total Protein: 7.2 g/dL (ref 6.0–8.3)

## 2024-05-09 LAB — LIPID PANEL
Cholesterol: 176 mg/dL (ref 0–200)
HDL: 50.7 mg/dL (ref >39.00)
LDL Cholesterol: 115 mg/dL — ABNORMAL HIGH (ref 0–99)
NonHDL: 125.68
Total CHOL/HDL Ratio: 3
Triglycerides: 54 mg/dL (ref 0.0–149.0)
VLDL: 10.8 mg/dL (ref 0.0–40.0)

## 2024-05-09 LAB — HEMOGLOBIN A1C: Hgb A1c MFr Bld: 6 % (ref 4.6–6.5)

## 2024-05-09 NOTE — Assessment & Plan Note (Signed)
 Uses xyzal and controlled.

## 2024-05-09 NOTE — Assessment & Plan Note (Signed)
 Flu shot yearly. Tetanus declines today. Colonoscopy up to date. Mammogram needs to do, pap smear collected today. Counseled about sun safety and mole surveillance. Counseled about the dangers of distracted driving. Given 10 year screening recommendations.

## 2024-05-09 NOTE — Progress Notes (Signed)
   Subjective:   Patient ID: Linda Kemp, female    DOB: Sep 22, 1976, 48 y.o.   MRN: [id number]  The patient is here for physical. Pertinent topics discussed: Discussed the use of AI scribe software for clinical note transcription with the patient, who gave verbal consent to proceed.  History of Present Illness No pertinent    PMH, FMH, social history reviewed and updated  Review of Systems  Constitutional: Negative.   HENT: Negative.    Eyes: Negative.   Respiratory:  Negative for cough, chest tightness and shortness of breath.   Cardiovascular:  Negative for chest pain, palpitations and leg swelling.  Gastrointestinal:  Negative for abdominal distention, abdominal pain, constipation, diarrhea, nausea and vomiting.  Musculoskeletal: Negative.   Skin: Negative.   Neurological: Negative.   Psychiatric/Behavioral: Negative.      Objective:  Physical Exam Exam conducted with a chaperone present.  Constitutional:      Appearance: She is well-developed.  HENT:     Head: Normocephalic and atraumatic.  Cardiovascular:     Rate and Rhythm: Normal rate and regular rhythm.  Pulmonary:     Effort: Pulmonary effort is normal. No respiratory distress.     Breath sounds: Normal breath sounds. No wheezing or rales.  Abdominal:     General: Bowel sounds are normal. There is no distension.     Palpations: Abdomen is soft.     Tenderness: There is no abdominal tenderness. There is no rebound.  Genitourinary:    General: Normal vulva.     Vagina: No vaginal discharge.     Comments: Pap collected Musculoskeletal:     Cervical back: Normal range of motion.  Skin:    General: Skin is warm and dry.  Neurological:     Mental Status: She is alert and oriented to person, place, and time.     Coordination: Coordination normal.     Vitals:   05/09/24 0952  BP: 100/68  Pulse: 63  Temp: 98 F (36.7 C)  TempSrc: Oral  SpO2: 98%  Weight: 165 lb (74.8 kg)  Height: 5' 7 (1.702 m)     Assessment & Plan:

## 2024-05-19 ENCOUNTER — Ambulatory Visit: Payer: Self-pay | Admitting: Internal Medicine

## 2024-05-19 LAB — CYTOLOGY - PAP: Adequacy: ABNORMAL

## 2024-06-19 ENCOUNTER — Other Ambulatory Visit: Payer: Self-pay | Admitting: Internal Medicine

## 2024-06-19 DIAGNOSIS — Z1231 Encounter for screening mammogram for malignant neoplasm of breast: Secondary | ICD-10-CM

## 2024-06-26 ENCOUNTER — Ambulatory Visit
Admission: RE | Admit: 2024-06-26 | Discharge: 2024-06-26 | Disposition: A | Discharge status: Home / Self Care | Source: Ambulatory Visit

## 2024-06-26 DIAGNOSIS — Z1231 Encounter for screening mammogram for malignant neoplasm of breast: Secondary | ICD-10-CM

## 2024-07-01 ENCOUNTER — Ambulatory Visit: Payer: Self-pay | Admitting: Internal Medicine

## 2024-07-01 LAB — HM MAMMOGRAPHY

## 2024-07-31 DIAGNOSIS — D235 Other benign neoplasm of skin of trunk: Secondary | ICD-10-CM | POA: Diagnosis not present

## 2024-07-31 DIAGNOSIS — L400 Psoriasis vulgaris: Secondary | ICD-10-CM | POA: Diagnosis not present

## 2024-07-31 DIAGNOSIS — D225 Melanocytic nevi of trunk: Secondary | ICD-10-CM | POA: Diagnosis not present

## 2024-07-31 DIAGNOSIS — D2372 Other benign neoplasm of skin of left lower limb, including hip: Secondary | ICD-10-CM | POA: Diagnosis not present

## 2024-09-08 DIAGNOSIS — L308 Other specified dermatitis: Secondary | ICD-10-CM | POA: Diagnosis not present

## 2024-09-08 DIAGNOSIS — L400 Psoriasis vulgaris: Secondary | ICD-10-CM | POA: Diagnosis not present
